# Patient Record
Sex: Male | Born: 1955 | Race: White | Hispanic: No | Marital: Married | Smoking: Never smoker
Health system: Southern US, Community
[De-identification: ages and names within clinical notes are randomized; demographics above are authoritative.]

## PROBLEM LIST (undated history)

## (undated) DIAGNOSIS — E538 Deficiency of other specified B group vitamins: Secondary | ICD-10-CM

## (undated) DIAGNOSIS — K219 Gastro-esophageal reflux disease without esophagitis: Secondary | ICD-10-CM

## (undated) DIAGNOSIS — Z8249 Family history of ischemic heart disease and other diseases of the circulatory system: Secondary | ICD-10-CM

## (undated) DIAGNOSIS — R600 Localized edema: Secondary | ICD-10-CM

## (undated) DIAGNOSIS — M549 Dorsalgia, unspecified: Secondary | ICD-10-CM

## (undated) DIAGNOSIS — M79661 Pain in right lower leg: Secondary | ICD-10-CM

## (undated) DIAGNOSIS — R079 Chest pain, unspecified: Secondary | ICD-10-CM

## (undated) DIAGNOSIS — M2550 Pain in unspecified joint: Secondary | ICD-10-CM

## (undated) DIAGNOSIS — J302 Other seasonal allergic rhinitis: Secondary | ICD-10-CM

## (undated) DIAGNOSIS — G4733 Obstructive sleep apnea (adult) (pediatric): Secondary | ICD-10-CM

## (undated) DIAGNOSIS — M25569 Pain in unspecified knee: Secondary | ICD-10-CM

## (undated) HISTORY — DX: Gastro-esophageal reflux disease without esophagitis: K21.9

## (undated) HISTORY — DX: Other seasonal allergic rhinitis: J30.2

## (undated) HISTORY — DX: Chest pain, unspecified: R07.9

## (undated) HISTORY — DX: Dorsalgia, unspecified: M54.9

## (undated) HISTORY — DX: Pain in unspecified knee: M25.569

## (undated) HISTORY — DX: Family history of ischemic heart disease and other diseases of the circulatory system: Z82.49

## (undated) HISTORY — DX: Pain in unspecified joint: M25.50

## (undated) HISTORY — PX: NOSE SURGERY: SHX723

## (undated) HISTORY — PX: KNEE SURGERY: SHX244

## (undated) HISTORY — DX: Obstructive sleep apnea (adult) (pediatric): G47.33

## (undated) HISTORY — DX: Pain in right lower leg: M79.661

## (undated) HISTORY — DX: Localized edema: R60.0

## (undated) HISTORY — DX: Deficiency of other specified B group vitamins: E53.8

---

## 1998-06-08 ENCOUNTER — Emergency Department (HOSPITAL_COMMUNITY): Admission: EM | Admit: 1998-06-08 | Discharge: 1998-06-08 | Payer: Self-pay | Admitting: Emergency Medicine

## 1998-12-02 ENCOUNTER — Observation Stay (HOSPITAL_COMMUNITY): Admission: EM | Admit: 1998-12-02 | Discharge: 1998-12-02 | Payer: Self-pay | Admitting: Emergency Medicine

## 1998-12-02 ENCOUNTER — Encounter: Payer: Self-pay | Admitting: Interventional Cardiology

## 1998-12-02 ENCOUNTER — Encounter: Payer: Self-pay | Admitting: Family Medicine

## 1998-12-31 ENCOUNTER — Encounter: Payer: Self-pay | Admitting: Family Medicine

## 1998-12-31 ENCOUNTER — Ambulatory Visit (HOSPITAL_COMMUNITY): Admission: RE | Admit: 1998-12-31 | Discharge: 1998-12-31 | Payer: Self-pay | Admitting: Family Medicine

## 2003-04-12 ENCOUNTER — Emergency Department (HOSPITAL_COMMUNITY): Admission: EM | Admit: 2003-04-12 | Discharge: 2003-04-13 | Payer: Self-pay | Admitting: Emergency Medicine

## 2004-10-27 ENCOUNTER — Emergency Department (HOSPITAL_COMMUNITY): Admission: EM | Admit: 2004-10-27 | Discharge: 2004-10-28 | Payer: Self-pay | Admitting: Emergency Medicine

## 2005-12-06 ENCOUNTER — Encounter: Payer: Self-pay | Admitting: Emergency Medicine

## 2008-01-21 ENCOUNTER — Emergency Department (HOSPITAL_COMMUNITY): Admission: EM | Admit: 2008-01-21 | Discharge: 2008-01-21 | Payer: Self-pay | Admitting: Family Medicine

## 2009-10-15 ENCOUNTER — Encounter: Admission: RE | Admit: 2009-10-15 | Discharge: 2009-10-15 | Payer: Self-pay | Admitting: Family Medicine

## 2010-01-08 ENCOUNTER — Emergency Department (HOSPITAL_COMMUNITY): Admission: EM | Admit: 2010-01-08 | Discharge: 2010-01-08 | Payer: Self-pay | Admitting: Emergency Medicine

## 2010-01-24 ENCOUNTER — Emergency Department (HOSPITAL_COMMUNITY): Admission: EM | Admit: 2010-01-24 | Discharge: 2010-01-24 | Payer: Self-pay | Admitting: Family Medicine

## 2010-02-10 DIAGNOSIS — G4733 Obstructive sleep apnea (adult) (pediatric): Secondary | ICD-10-CM

## 2010-02-10 HISTORY — DX: Obstructive sleep apnea (adult) (pediatric): G47.33

## 2010-04-06 ENCOUNTER — Ambulatory Visit: Admission: RE | Admit: 2010-04-06 | Discharge: 2010-04-06 | Payer: Self-pay | Admitting: Family Medicine

## 2010-04-06 ENCOUNTER — Encounter (INDEPENDENT_AMBULATORY_CARE_PROVIDER_SITE_OTHER): Payer: Self-pay | Admitting: Family Medicine

## 2010-04-06 ENCOUNTER — Ambulatory Visit: Payer: Self-pay | Admitting: Vascular Surgery

## 2010-10-24 LAB — CBC
Hemoglobin: 16.8 g/dL (ref 13.0–17.0)
MCV: 90.8 fL (ref 78.0–100.0)
WBC: 9.5 10*3/uL (ref 4.0–10.5)

## 2010-10-24 LAB — COMPREHENSIVE METABOLIC PANEL
ALT: 25 U/L (ref 0–53)
AST: 30 U/L (ref 0–37)
Albumin: 3.9 g/dL (ref 3.5–5.2)
Alkaline Phosphatase: 102 U/L (ref 39–117)
BUN: 10 mg/dL (ref 6–23)
Calcium: 8.4 mg/dL (ref 8.4–10.5)
Chloride: 104 mEq/L (ref 96–112)
Glucose, Bld: 95 mg/dL (ref 70–99)
Potassium: 4 mEq/L (ref 3.5–5.1)
Total Protein: 7.6 g/dL (ref 6.0–8.3)

## 2010-10-24 LAB — DIFFERENTIAL
Basophils Relative: 0 % (ref 0–1)
Monocytes Relative: 6 % (ref 3–12)
Neutro Abs: 8.3 10*3/uL — ABNORMAL HIGH (ref 1.7–7.7)

## 2010-10-24 LAB — PROTIME-INR
INR: 1.01 (ref 0.00–1.49)
Prothrombin Time: 13.2 seconds (ref 11.6–15.2)

## 2010-10-24 LAB — APTT: aPTT: 27 seconds (ref 24–37)

## 2015-05-12 ENCOUNTER — Other Ambulatory Visit: Payer: Self-pay | Admitting: Family Medicine

## 2015-05-12 DIAGNOSIS — M79669 Pain in unspecified lower leg: Secondary | ICD-10-CM

## 2015-05-19 ENCOUNTER — Ambulatory Visit
Admission: RE | Admit: 2015-05-19 | Discharge: 2015-05-19 | Disposition: A | Discharge status: Home / Self Care | Payer: BC Managed Care – PPO | Source: Ambulatory Visit | Attending: Family Medicine | Admitting: Family Medicine

## 2015-05-19 DIAGNOSIS — M79669 Pain in unspecified lower leg: Secondary | ICD-10-CM

## 2015-05-25 ENCOUNTER — Encounter: Payer: Self-pay | Admitting: Interventional Cardiology

## 2015-05-31 ENCOUNTER — Encounter: Payer: Self-pay | Admitting: Interventional Cardiology

## 2015-05-31 ENCOUNTER — Ambulatory Visit (INDEPENDENT_AMBULATORY_CARE_PROVIDER_SITE_OTHER): Payer: BC Managed Care – PPO | Admitting: Interventional Cardiology

## 2015-05-31 VITALS — BP 110/70 | HR 82 | Ht 73.0 in | Wt 244.0 lb

## 2015-05-31 DIAGNOSIS — K219 Gastro-esophageal reflux disease without esophagitis: Secondary | ICD-10-CM

## 2015-05-31 DIAGNOSIS — Z8249 Family history of ischemic heart disease and other diseases of the circulatory system: Secondary | ICD-10-CM | POA: Diagnosis not present

## 2015-05-31 HISTORY — DX: Family history of ischemic heart disease and other diseases of the circulatory system: Z82.49

## 2015-05-31 HISTORY — DX: Gastro-esophageal reflux disease without esophagitis: K21.9

## 2015-05-31 NOTE — Progress Notes (Signed)
Patient ID: James Fernandez, male   DOB: Sep 20, 1955, 59 y.o.   MRN: [id number]     Cardiology Office Note   Date:  05/31/2015   ID:  James Fernandez, DOB [birth year], MRN [id number]  PCP:  James Blamer, MD    No chief complaint on file. Chest discomfort   Wt Readings from Last 3 Encounters:  05/31/15 244 lb (110.678 kg)       History of Present Illness: James Fernandez is a 59 y.o. male  Who has had GERD sx for many years.  He was found to have a decreased pulse on the right foot.  His brother who is 5 years older recently had a CABG x 4.   The patient has changed his diet to prevent GERD.  He does not do a lot of exercise.  He does do work around the house.  No GERD sx yesterday when he moved some wood.    Due to his brother's recent diagnosis of coronary artery disease, he is concerned. His brother is a few years older and did smoke for some time. The patient states that the brother's lifestyle is not as healthy. Regardless, this has brought on some concern for him.  He recalls having a stress test in the year 2000 or 2001 which was negative. He has not had a cardiac evaluation since then.    Past Medical History  Diagnosis Date  . Esophageal reflux   . Seasonal allergies   . Knee pain   . OSA (obstructive sleep apnea) 02/10/10    ESS 18, AHI 23/hr, RDI 29/hr, no REM, O2 nadir 81%; CPAP 9 with AHI 0/hr.  . Chest pain   . Right calf pain     History reviewed. No pertinent past surgical history.   Current Outpatient Prescriptions  Medication Sig Dispense Refill  . pantoprazole (PROTONIX) 40 MG tablet Take 40 mg by mouth daily.    . ranitidine (ZANTAC) 150 MG capsule Take 150 mg by mouth daily as needed for heartburn.     No current facility-administered medications for this visit.    Allergies:   Demerol    Social History:  The patient  reports that he has never smoked. He does not have any smokeless tobacco history on file. He reports that he does not drink alcohol  or use illicit drugs.   Family History:  The patient's family history includes Heart failure in his maternal grandmother; Heart failure (age of onset: 54) in his mother; Prostate cancer in his maternal grandfather; Stroke in his maternal grandfather. There is no history of Heart attack or Hypertension.    ROS:  Please see the history of present illness.   Otherwise, review of systems are positive for acid reflux symptoms.   All other systems are reviewed and negative.    PHYSICAL EXAM: VS:  BP 110/70 mmHg  Pulse 82  Ht  (1.854 m)  Wt 244 lb (110.678 kg)  BMI 32.20 kg/m2  SpO2 94% , BMI Body mass index is 32.2 kg/(m^2). GEN: Well nourished, well developed, in no acute distress HEENT: normal Neck: no JVD, carotid bruits, or masses Cardiac: RRR; no murmurs, rubs, or gallops,1+ pitting lower extremity edema bilaterally, 2+ posterior tibial pulses bilaterally Respiratory:  clear to auscultation bilaterally, normal work of breathing GI: soft, nontender, nondistended, + BS MS: no deformity or atrophy Skin: warm and dry, no rash Neuro:  Strength and sensation are intact Psych: euthymic mood, full affect   EKG:   The ekg  from primary care demonstrates normal ECG   Recent Labs: No results found for requested labs within last 365 days.   Lipid Panel No results found for: CHOL, TRIG, HDL, CHOLHDL, VLDL, LDLCALC, LDLDIRECT   Other studies Reviewed: Additional studies/ records that were reviewed today with results demonstrating: ECG from primary care as noted above, hemoglobin A1c 5.4 in October 2016, normal TSH in October 2016, LDL 106 in August 2015.   ASSESSMENT AND PLAN:  1. Family history of coronary artery disease: Plan for exercise treadmill test to evaluate for ischemia. He'll need aggressive lifestyle changes including an increase exercise and eating healthy diet. He would benefit from some mild weight loss as well. 2. GERD: Want to make sure this symptom is not related to  heart disease. 3. Edema: Likely related to venous insufficiency. Elevate legs at night to help.   Current medicines are reviewed at length with the patient today.  The patient concerns regarding his medicines were addressed.  The following changes have been made:  No change  Labs/ tests ordered today include:   Orders Placed This Encounter  Procedures  . Exercise Tolerance Test    Recommend 150 minutes/week of aerobic exercise Low fat, low carb, high fiber diet recommended  Disposition:   FU in 1 year, also for ETT in near future   James JacksonSigned, James S., MD  05/31/2015 5:13 PM    [facility] [address], [place], [place]  [address] Phone: [phone]; Fax: [phone]

## 2015-05-31 NOTE — Patient Instructions (Signed)
**Note De-identified James Fernandez Obfuscation** Medication Instructions:  Same-no changes  Labwork: None  Testing/Procedures: Your physician has requested that you have an exercise tolerance test. For further information please visit [email or web address]. Please also follow instruction sheet, as given.   Follow-Up: Your physician wants you to follow-up in: 1 year. You will receive a reminder letter in the mail two months in advance. If you don't receive a letter, please call our office to schedule the follow-up appointment.       If you need a refill on your cardiac medications before your next appointment, please call your pharmacy.   

## 2015-07-06 DIAGNOSIS — M79661 Pain in right lower leg: Secondary | ICD-10-CM | POA: Insufficient documentation

## 2015-07-06 DIAGNOSIS — K219 Gastro-esophageal reflux disease without esophagitis: Secondary | ICD-10-CM | POA: Insufficient documentation

## 2015-07-06 DIAGNOSIS — R079 Chest pain, unspecified: Secondary | ICD-10-CM | POA: Insufficient documentation

## 2015-07-06 DIAGNOSIS — M25569 Pain in unspecified knee: Secondary | ICD-10-CM | POA: Insufficient documentation

## 2015-07-06 DIAGNOSIS — J302 Other seasonal allergic rhinitis: Secondary | ICD-10-CM | POA: Insufficient documentation

## 2015-07-07 ENCOUNTER — Encounter: Payer: BC Managed Care – PPO | Admitting: Nurse Practitioner

## 2015-07-07 ENCOUNTER — Ambulatory Visit (INDEPENDENT_AMBULATORY_CARE_PROVIDER_SITE_OTHER): Payer: BC Managed Care – PPO

## 2015-07-07 DIAGNOSIS — Z8249 Family history of ischemic heart disease and other diseases of the circulatory system: Secondary | ICD-10-CM

## 2015-07-07 LAB — EXERCISE TOLERANCE TEST
CSEPED: 9 min
CSEPPHR: 164 {beats}/min
Estimated workload: 10.1 METS
Exercise duration (sec): 0 s
MPHR: 163 {beats}/min
Percent HR: 100 %
RPE: 15
Rest HR: 81 {beats}/min

## 2015-08-17 ENCOUNTER — Encounter: Payer: Self-pay | Admitting: Gastroenterology

## 2018-09-19 ENCOUNTER — Encounter (INDEPENDENT_AMBULATORY_CARE_PROVIDER_SITE_OTHER): Payer: Self-pay

## 2018-09-23 ENCOUNTER — Emergency Department (HOSPITAL_COMMUNITY)
Admission: EM | Admit: 2018-09-23 | Discharge: 2018-09-23 | Disposition: A | Discharge status: Home / Self Care | Payer: BC Managed Care – PPO | Attending: Emergency Medicine | Admitting: Emergency Medicine

## 2018-09-23 ENCOUNTER — Emergency Department (HOSPITAL_COMMUNITY): Payer: BC Managed Care – PPO

## 2018-09-23 DIAGNOSIS — R1110 Vomiting, unspecified: Secondary | ICD-10-CM | POA: Diagnosis not present

## 2018-09-23 DIAGNOSIS — Z79899 Other long term (current) drug therapy: Secondary | ICD-10-CM | POA: Insufficient documentation

## 2018-09-23 LAB — CBC
HCT: 50.7 % (ref 39.0–52.0)
HEMOGLOBIN: 16.5 g/dL (ref 13.0–17.0)
MCH: 29.7 pg (ref 26.0–34.0)
MCHC: 32.5 g/dL (ref 30.0–36.0)
MCV: 91.2 fL (ref 80.0–100.0)
Platelets: 212 10*3/uL (ref 150–400)
RBC: 5.56 MIL/uL (ref 4.22–5.81)
RDW: 12.6 % (ref 11.5–15.5)
WBC: 14.1 10*3/uL — AB (ref 4.0–10.5)
nRBC: 0 % (ref 0.0–0.2)

## 2018-09-23 LAB — COMPREHENSIVE METABOLIC PANEL
ALT: 30 U/L (ref 0–44)
AST: 28 U/L (ref 15–41)
Albumin: 4.3 g/dL (ref 3.5–5.0)
Alkaline Phosphatase: 94 U/L (ref 38–126)
Anion gap: 9 (ref 5–15)
BUN: 17 mg/dL (ref 8–23)
CHLORIDE: 103 mmol/L (ref 98–111)
CO2: 26 mmol/L (ref 22–32)
Calcium: 8.7 mg/dL — ABNORMAL LOW (ref 8.9–10.3)
Creatinine, Ser: 0.93 mg/dL (ref 0.61–1.24)
Glucose, Bld: 122 mg/dL — ABNORMAL HIGH (ref 70–99)
Potassium: 4.5 mmol/L (ref 3.5–5.1)
SODIUM: 138 mmol/L (ref 135–145)
Total Bilirubin: 1.4 mg/dL — ABNORMAL HIGH (ref 0.3–1.2)
Total Protein: 8.2 g/dL — ABNORMAL HIGH (ref 6.5–8.1)

## 2018-09-23 LAB — LIPASE, BLOOD: LIPASE: 35 U/L (ref 11–51)

## 2018-09-23 MED ORDER — SODIUM CHLORIDE 0.9 % IV BOLUS
1000.0000 mL | Freq: Once | INTRAVENOUS | Status: AC
  Administered 2018-09-23: 1000 mL via INTRAVENOUS

## 2018-09-23 MED ORDER — KETOROLAC TROMETHAMINE 30 MG/ML IJ SOLN
30.0000 mg | Freq: Once | INTRAMUSCULAR | Status: AC
  Administered 2018-09-23: 30 mg via INTRAVENOUS
  Filled 2018-09-23: qty 1

## 2018-09-23 MED ORDER — DICYCLOMINE HCL 20 MG PO TABS: ORAL_TABLET | ORAL | 0 refills | Status: DC

## 2018-09-23 MED ORDER — ONDANSETRON 4 MG PO TBDP: ORAL_TABLET | ORAL | 0 refills | Status: DC

## 2018-09-23 MED ORDER — SODIUM CHLORIDE 0.9% FLUSH
3.0000 mL | Freq: Once | INTRAVENOUS | Status: AC
  Administered 2018-09-23: 3 mL via INTRAVENOUS

## 2018-09-23 MED FILL — ONDANSETRON ODT 4 MG TABLET: 4 | 2 days supply | Qty: 15 | Fill #0

## 2018-09-23 MED FILL — DICYCLOMINE 20 MG TABLET: 20 | 6 days supply | Qty: 20 | Fill #0

## 2018-09-23 NOTE — ED Notes (Signed)
Bed: WA13 Expected date:  Expected time:  Means of arrival:  Comments: EMS/n/v/ 

## 2018-09-23 NOTE — ED Triage Notes (Signed)
Transported by [facility] from home-- + n/v x 6 hours, approximately 30-40 x per patient. +generalized abdominal pain, denies any urinary symptoms or trouble with bowel movements. 18 G R AC, 500 ml of NS and 4 mg of Zofran PTA.

## 2018-09-23 NOTE — Discharge Instructions (Addendum)
Drink plenty of fluids and follow-up with your doctor if not improving °

## 2018-09-23 NOTE — ED Provider Notes (Signed)
[facility]-EMERGENCY DEPT Provider Note   CSN: [id number] Arrival date & time: 09/23/18  0755    History   Chief Complaint Chief Complaint  Patient presents with  . Emesis    HPI James Fernandez is a 63 y.o. male.     Patient complains of vomiting and abdominal cramping.  The history is provided by the patient. No language interpreter was used.  Emesis  Severity:  Moderate Timing:  Constant Number of daily episodes:  5 Quality:  Stomach contents Able to tolerate:  Liquids Progression:  Worsening Chronicity:  New Associated symptoms: abdominal pain   Associated symptoms: no cough, no diarrhea and no headaches     Past Medical History:  Diagnosis Date  . Chest pain   . Esophageal reflux   . Family history of coronary arteriosclerosis 05/31/2015  . GERD (gastroesophageal reflux disease) 05/31/2015  . Knee pain   . OSA (obstructive sleep apnea) 02/10/10   ESS 18, AHI 23/hr, RDI 29/hr, no REM, O2 nadir 81%; CPAP 9 with AHI 0/hr.  . Right calf pain   . Seasonal allergies     Patient Active Problem List   Diagnosis Date Noted  . Esophageal reflux   . Seasonal allergies   . Knee pain   . Chest pain   . Right calf pain   . GERD (gastroesophageal reflux disease) 05/31/2015  . Family history of coronary arteriosclerosis 05/31/2015  . OSA (obstructive sleep apnea) 02/10/2010    Past Surgical History:  Procedure Laterality Date  . NO PAST SURGERIES          Home Medications    Prior to Admission medications   Medication Sig Start Date End Date Taking? Authorizing Provider  Multiple Vitamin (MULTIVITAMIN WITH MINERALS) TABS tablet Take 1 tablet by mouth daily.   Yes [provider]  omeprazole (PRILOSEC) 20 MG capsule Take 20 mg by mouth daily.   Yes [provider]  dicyclomine (BENTYL) 20 MG tablet Take 1 every 8 hours as needed for abdominal cramps 09/23/18   Bethann BerkshireZammit, Nysha Koplin, MD  ondansetron (ZOFRAN ODT) 4 MG  disintegrating tablet 4mg  ODT q4 hours prn nausea/vomit 09/23/18   Bethann BerkshireZammit, Jenene Kauffmann, MD    Family History Family History  Problem Relation Age of Onset  . Heart failure Mother 3966  . Heart failure Maternal Grandmother   . Prostate cancer Maternal Grandfather   . Heart attack Neg Hx   . Hypertension Neg Hx   . Stroke Maternal Grandfather        6270    Social History Social History   Tobacco Use  . Smoking status: Never Smoker  Substance Use Topics  . Alcohol use: No    Alcohol/week: 0.0 standard drinks  . Drug use: No     Allergies   Demerol [meperidine]   Review of Systems Review of Systems  Constitutional: Negative for appetite change and fatigue.  HENT: Negative for congestion, ear discharge and sinus pressure.   Eyes: Negative for discharge.  Respiratory: Negative for cough.   Cardiovascular: Negative for chest pain.  Gastrointestinal: Positive for abdominal pain and vomiting. Negative for diarrhea.  Genitourinary: Negative for frequency and hematuria.  Musculoskeletal: Negative for back pain.  Skin: Negative for rash.  Neurological: Negative for seizures and headaches.  Psychiatric/Behavioral: Negative for hallucinations.     Physical Exam Updated Vital Signs BP 133/73 (BP Location: Left Arm)   Pulse 87   Temp 98.8 F (37.1 C)   Resp 16   SpO2  95%   Physical Exam Vitals signs and nursing note reviewed.  Constitutional:      Appearance: He is well-developed.  HENT:     Head: Normocephalic.     Nose: Nose normal.  Eyes:     General: No scleral icterus.    Conjunctiva/sclera: Conjunctivae normal.  Neck:     Musculoskeletal: Neck supple.     Thyroid: No thyromegaly.  Cardiovascular:     Rate and Rhythm: Normal rate and regular rhythm.     Heart sounds: No murmur. No friction rub. No gallop.   Pulmonary:     Breath sounds: No stridor. No wheezing or rales.  Chest:     Chest wall: No tenderness.  Abdominal:     General: There is no distension.      Tenderness: There is abdominal tenderness. There is no rebound.     Comments: Tender right upper quadrant  Musculoskeletal: Normal range of motion.  Lymphadenopathy:     Cervical: No cervical adenopathy.  Skin:    Findings: No erythema or rash.  Neurological:     Mental Status: He is oriented to person, place, and time.     Motor: No abnormal muscle tone.     Coordination: Coordination normal.  Psychiatric:        Behavior: Behavior normal.      ED Treatments / Results  Labs (all labs ordered are listed, but only abnormal results are displayed) Labs Reviewed  COMPREHENSIVE METABOLIC PANEL - Abnormal; Notable for the following components:      Result Value   Glucose, Bld 122 (*)    Calcium 8.7 (*)    Total Protein 8.2 (*)    Total Bilirubin 1.4 (*)    All other components within normal limits  CBC - Abnormal; Notable for the following components:   WBC 14.1 (*)    All other components within normal limits  LIPASE, BLOOD    EKG None  Radiology US Abdomen Complete  Result Date: 09/23/2018 CLINICAL DATA:  Abdominal pain, nausea and vomiting. EXAM: ABDOMEN ULTRASOUND COMPLETE COMPARISON:  CT scan 01/08/2010 FINDINGS: Gallbladder: No gallstones, wall thickening or pericholecystic fluid. Patient was tender over the gallbladder during the examination. Common bile duct: Diameter: 2.2 mm Liver: Normal echogenicity without focal lesion or biliary dilatation. Portal vein is patent on color Doppler imaging with normal direction of blood flow towards the liver. IVC: Normal caliber Pancreas: Poorly visualized due to overlying bowel gas. Spleen: Normal size.  No focal lesions. Right Kidney: Length: 12.7. Normal renal cortical thickness and echogenicity without focal lesions or hydronephrosis. Left Kidney: Length: 13.3. Normal renal cortical thickness and echogenicity without focal lesions or hydronephrosis. Abdominal aorta: No aneurysm visualized. Other findings: None. IMPRESSION: 1.  Positive sonographic Murphy sign but no sonographic findings to suggest acute cholecystitis. 2. Normal sonographic appearance of the liver, spleen and both kidneys. 3. Poor visualization of the pancreas and limited visualization of the aorta. Electronically Signed   By: Rudie Meyer M.D.   On: 09/23/2018 14:23   Dg Abd Acute 2+v W 1v Chest  Result Date: 09/23/2018 CLINICAL DATA:  Diffuse abdominal pain. Intractable nausea and vomiting for 1 day. EXAM: DG ABDOMEN ACUTE W/ 1V CHEST COMPARISON:  CT abdomen and pelvis 01/28/2010. FINDINGS: Heart size is normal. Mild left basilar airspace disease is present. Lungs are otherwise clear. There is no edema or effusion. Bowel gas pattern is normal.  No obstruction or free air is present. There is partial sacralization of L5. Axial skeleton  is otherwise within normal limits. IMPRESSION: 1. Mild left basilar airspace disease is present. This likely reflects atelectasis. 2. Normal bowel gas pattern. No acute or focal abnormality to explain the patient's abdominal pain. Electronically Signed   By: Marin Roberts M.D.   On: 09/23/2018 11:40    Procedures Procedures (including critical care time)  Medications Ordered in ED Medications  sodium chloride flush (NS) 0.9 % injection 3 mL (3 mLs Intravenous Given 09/23/18 0816)  sodium chloride 0.9 % bolus 1,000 mL (0 mLs Intravenous Stopped 09/23/18 1126)  sodium chloride 0.9 % bolus 1,000 mL (0 mLs Intravenous Stopped 09/23/18 1200)  ketorolac (TORADOL) 30 MG/ML injection 30 mg (30 mg Intravenous Given 09/23/18 1016)     Initial Impression / Assessment and Plan / ED Course  I have reviewed the triage vital signs and the nursing notes.  Pertinent labs & imaging results that were available during my care of the patient were reviewed by me and considered in my medical decision making (see chart for details).        Patient with vomiting.  Labs unremarkable except for white count slightly elevated.   Ultrasound negative.  Suspect viral syndrome.  Patient given Zofran Bentyl and told to double up on his Prilosec and he will follow-up with his PCP Final Clinical Impressions(s) / ED Diagnoses   Final diagnoses:  Acute vomiting    ED Discharge Orders         Ordered    ondansetron (ZOFRAN ODT) 4 MG disintegrating tablet     09/23/18 1441    dicyclomine (BENTYL) 20 MG tablet     09/23/18 1441           Bethann Berkshire, MD 09/23/18 1444

## 2018-09-26 ENCOUNTER — Encounter (INDEPENDENT_AMBULATORY_CARE_PROVIDER_SITE_OTHER): Payer: Self-pay

## 2018-09-30 ENCOUNTER — Encounter (INDEPENDENT_AMBULATORY_CARE_PROVIDER_SITE_OTHER): Payer: Self-pay

## 2018-10-01 ENCOUNTER — Encounter (INDEPENDENT_AMBULATORY_CARE_PROVIDER_SITE_OTHER): Payer: Self-pay | Admitting: Family Medicine

## 2018-10-01 ENCOUNTER — Ambulatory Visit (INDEPENDENT_AMBULATORY_CARE_PROVIDER_SITE_OTHER): Payer: BC Managed Care – PPO | Admitting: Family Medicine

## 2018-10-01 VITALS — BP 111/73 | HR 71 | Ht 73.0 in | Wt 236.0 lb

## 2018-10-01 DIAGNOSIS — Z0289 Encounter for other administrative examinations: Secondary | ICD-10-CM

## 2018-10-01 DIAGNOSIS — E669 Obesity, unspecified: Secondary | ICD-10-CM

## 2018-10-01 DIAGNOSIS — R0602 Shortness of breath: Secondary | ICD-10-CM | POA: Diagnosis not present

## 2018-10-01 DIAGNOSIS — E538 Deficiency of other specified B group vitamins: Secondary | ICD-10-CM

## 2018-10-01 DIAGNOSIS — E559 Vitamin D deficiency, unspecified: Secondary | ICD-10-CM

## 2018-10-01 DIAGNOSIS — R5383 Other fatigue: Secondary | ICD-10-CM

## 2018-10-01 DIAGNOSIS — Z6831 Body mass index (BMI) 31.0-31.9, adult: Secondary | ICD-10-CM

## 2018-10-01 DIAGNOSIS — G4733 Obstructive sleep apnea (adult) (pediatric): Secondary | ICD-10-CM | POA: Diagnosis not present

## 2018-10-01 DIAGNOSIS — Z1331 Encounter for screening for depression: Secondary | ICD-10-CM

## 2018-10-01 DIAGNOSIS — R739 Hyperglycemia, unspecified: Secondary | ICD-10-CM

## 2018-10-01 DIAGNOSIS — Z9189 Other specified personal risk factors, not elsewhere classified: Secondary | ICD-10-CM

## 2018-10-01 NOTE — Progress Notes (Signed)
.  Office: [phone]  /  Fax: [phone]   HPI:   Chief Complaint: OBESITY  James Fernandez (MR# [id number]) is a 63 y.o. male who presents on 10/01/2018 for obesity evaluation and treatment. Current BMI is Body mass index is 31.14 kg/m.  James Fernandez has struggled with obesity for years and has been unsuccessful in either losing weight or maintaining long term weight loss. James Fernandez attended our information session and states he is currently in the action stage of change and ready to dedicate time achieving and maintaining a healthier weight.   James Fernandez states his family eats meals together he thinks his family will eat healthier with him his desired weight loss is 36 lbs he started gaining weight in the last 20 years his heaviest weight ever was 254 lbs. he snacks frequently in the evenings he is trying to eat vegetarian he is frequently drinking liquids with calories he has binge eating behaviors   Fatigue James Fernandez feels his energy is lower than it should be. This has worsened with weight gain and has not worsened recently. Patient is at risk for obstructive sleep apnea. Patent has a history of sleep apnea. Patient generally gets 5 or 6 hours of sleep per night, and states they generally have generally restful sleep. Snoring is not present. Apneic episodes are not present. Epworth Sleepiness Score is 17.  Dyspnea on exertion James Fernandez notes increasing shortness of breath with exercising and seems to be worsening over time with weight gain. He notes getting out of breath sooner with activity than he used to. This has not gotten worse recently. James Fernandez denies orthopnea.  Vitamin B12 Deficiency James Fernandez has a diagnosis of B12 insufficiency and notes fatigue. This is not a new diagnosis and he is on a multivitamin. James Fernandez is a vegetarian and is not eating much protein. He does not have a previous diagnosis of pernicious anemia. He does not have a history of weight loss surgery.   Sleep  Apnea James Fernandez's Epworth Score is 17 and he is not using a CPAP. He admits fatigue.  Vitamin D Deficiency James Fernandez has a diagnosis of vitamin D deficiency. He is currently taking a multivitamin and does not have recent labs. He admits fatigue.  Hyperglycemia James Fernandez has a history of some elevated blood glucose readings last week while he was sick. He admits to polyphagia.  At risk for cardiovascular disease James Fernandez is at a higher than average risk for cardiovascular disease due to sleep apnea, hyperglycemia, and obesity.  Depression Screen James Fernandez's Food and Mood (modified PHQ-9) score was 4. Depression screen James Fernandez 2/9 10/01/2018  Decreased Interest 1  Down, Depressed, Hopeless 0  PHQ - 2 Score 1  Altered sleeping 2  Tired, decreased energy 1  Change in appetite 0  Feeling bad or failure about yourself  0  Trouble concentrating 0  Moving slowly or fidgety/restless 0  Suicidal thoughts 0  PHQ-9 Score 4  Difficult doing work/chores Not difficult at all   ASSESSMENT AND PLAN:  Other fatigue - Plan: EKG 12-Lead, CBC With Differential, Comprehensive metabolic panel, Folate, T3, T4, free, TSH  Shortness of breath on exertion - Plan: Lipid Panel With LDL/HDL Ratio  B12 nutritional deficiency - Plan: Vitamin B12  Obstructive sleep apnea syndrome  Vitamin D deficiency - Plan: VITAMIN D 25 Hydroxy (Vit-D Deficiency, Fractures)  Hyperglycemia - Plan: Hemoglobin A1c, Insulin, random  Depression screening  At risk for heart disease  Class 1 obesity with serious comorbidity and body mass index (BMI) of 31.0 to 31.9  in adult, unspecified obesity type  PLAN:  Fatigue James Fernandez was informed that his fatigue may be related to obesity, depression or many other causes. Labs will be ordered, and in the meanwhile James Fernandez has agreed to work on diet, exercise and weight loss to help with fatigue. Proper sleep hygiene was discussed including the need for 7-8 hours of quality sleep each night. A  sleep study was not ordered based on symptoms and Epworth score. An EKG and an indirect calorimetry was ordered today.  Dyspnea on exertion James Fernandez's shortness of breath appears to be obesity related and exercise induced. He has agreed to work on weight loss and gradually increase exercise to treat his exercise induced shortness of breath. If James Fernandez follows our instructions and loses weight without improvement of his shortness of breath, we will plan to refer to pulmonology.  We ordered an indirect calorimetry, EKG, and labs today.We will monitor this condition regularly. James Fernandez agrees to this plan.  Vitamin B12 Deficiency James Fernandez will work on increasing B12 rich foods in his diet. B12 supplementation was not prescribed today. We will plan on checking labs today and he will follow up as directed.  Sleep Apnea James Fernandez's indirect calorimetry today show that his RMR is decreased likely in part to his reduced oxygen level. James Fernandez was encouraged to reconsider the CPAP and will work on diet and exercise in the meanwhile. He agrees to follow up as directed.  Vitamin D Deficiency James Fernandez was informed that low vitamin D levels contributes to fatigue and are associated with obesity, breast, and colon cancer. Labs were ordered today and James Fernandez agrees to follow up in 2 weeks.  Hyperglycemia Fasting labs will be obtained and results with be discussed with James Fernandez in 2 weeks at his follow up visit. In the meanwhile James Fernandez was started on a lower simple carbohydrate diet and will work on weight loss efforts. He will follow up at the agreed upon time.  Cardiovascular risk counseling James Fernandez was given extended (15 minutes) coronary artery disease prevention counseling today. He is 63 y.o. male and has risk factors for heart disease including sleep apnea, hyperglycemia, and obesity. We discussed intensive lifestyle modifications today with an emphasis on specific weight loss instructions and strategies. Pt was  also informed of the importance of increasing exercise and decreasing saturated fats to help prevent heart disease.  Depression Screen James Fernandez had a negative depression screening. Depression is commonly associated with obesity and often results in emotional eating behaviors. We will monitor this closely and work on CBT to help improve the non-hunger eating patterns. Referral to Psychology may be required if no improvement is seen as he continues in our clinic.  Obesity James Fernandez is currently in the action stage of change and his goal is to continue with weight loss efforts. He has agreed to keep a food journal with 1400 to 1500 calories and 85+ grams of protein daily. James Fernandez has been instructed to work up to a goal of 150 minutes of combined cardio and strengthening exercise per week for weight loss and overall health benefits. We discussed the following Behavioral Modification Strategies today: increasing lean protein intake, decreasing simple carbohydrates, and work on meal planning and easy cooking plans.  James Fernandez has agreed to follow up with our clinic in 2 weeks. He was informed of the importance of frequent follow up visits to maximize his success with intensive lifestyle modifications for his multiple health conditions. He was informed we would discuss his lab results at his next visit unless there  is a critical issue that needs to be addressed sooner. James Fernandez agreed to keep his next visit at the agreed upon time to discuss these results.  ALLERGIES: Allergies  Allergen Reactions  . Demerol [Meperidine]     BP bottom out    MEDICATIONS: Current Outpatient Medications on File Prior to Visit  Medication Sig Dispense Refill  . Multiple Vitamin (MULTIVITAMIN WITH MINERALS) TABS tablet Take 1 tablet by mouth daily.    Marland Kitchen omeprazole (PRILOSEC) 20 MG capsule Take 20 mg by mouth daily.     No current facility-administered medications on file prior to visit.     PAST MEDICAL HISTORY: Past  Medical History:  Diagnosis Date  . Back pain   . Chest pain   . Esophageal reflux   . Family history of coronary arteriosclerosis 05/31/2015  . GERD (gastroesophageal reflux disease) 05/31/2015  . Joint pain   . Knee pain   . Lower extremity edema   . OSA (obstructive sleep apnea) 02/10/10   ESS 18, AHI 23/hr, RDI 29/hr, no REM, O2 nadir 81%; CPAP 9 with AHI 0/hr.  . Right calf pain   . Seasonal allergies   . Vitamin B 12 deficiency     PAST SURGICAL HISTORY: Past Surgical History:  Procedure Laterality Date  . KNEE SURGERY     05/1975, 09/1975  . NOSE SURGERY     11/1973, 10/1979    SOCIAL HISTORY: Social History   Tobacco Use  . Smoking status: Never Smoker  . Smokeless tobacco: Never Used  Substance Use Topics  . Alcohol use: No    Alcohol/week: 0.0 standard drinks  . Drug use: No    FAMILY HISTORY: Family History  Problem Relation Age of Onset  . Heart failure Mother 27  . Anxiety disorder Mother   . Heart failure Maternal Grandmother   . Prostate cancer Maternal Grandfather   . Stroke Maternal Grandfather        5  . Heart attack Neg Hx   . Hypertension Neg Hx     ROS: Review of Systems  Constitutional: Positive for malaise/fatigue. Negative for weight loss.  HENT:       Positive for nasal stuffiness. Positive for dry mouth.  Eyes:       Positive for vision changes.  Respiratory: Positive for shortness of breath.   Cardiovascular: Negative for orthopnea.       Positive for leg cramping.  Gastrointestinal: Positive for heartburn.  Musculoskeletal: Positive for back pain, joint pain and myalgias.  Skin:       Positive for dryness.  Endo/Heme/Allergies: Positive for polydipsia.    PHYSICAL EXAM: Blood pressure 111/73, pulse 71, height  (1.854 m), weight 236 lb (107 kg), SpO2 96 %. Body mass index is 31.14 kg/m. Physical Exam Vitals signs reviewed.  Constitutional:      Appearance: Normal appearance. He is obese.  HENT:     Head:  Normocephalic and atraumatic.     Nose: Nose normal.  Eyes:     General: No scleral icterus.    Extraocular Movements: Extraocular movements intact.  Neck:     Musculoskeletal: Normal range of motion and neck supple.     Thyroid: No thyromegaly.     Comments: Negative for thyromegaly. Cardiovascular:     Rate and Rhythm: Normal rate and regular rhythm.  Pulmonary:     Effort: Pulmonary effort is normal. No respiratory distress.  Abdominal:     Palpations: Abdomen is soft.     Tenderness:  There is no abdominal tenderness.     Comments: Positive for obesity.  Musculoskeletal:     Comments: ROM normal in all extremities.  Skin:    General: Skin is warm and dry.  Neurological:     Mental Status: He is alert and oriented to person, place, and time.     Coordination: Coordination normal.  Psychiatric:        Mood and Affect: Mood normal.        Behavior: Behavior normal.     RECENT LABS AND TESTS: BMET    Component Value Date/Time   NA 138 09/23/2018 0806   K 4.5 09/23/2018 0806   CL 103 09/23/2018 0806   CO2 26 09/23/2018 0806   GLUCOSE 122 (H) 09/23/2018 0806   BUN 17 09/23/2018 0806   CREATININE 0.93 09/23/2018 0806   CALCIUM 8.7 (L) 09/23/2018 0806   GFRNONAA >60 09/23/2018 0806   GFRAA >60 09/23/2018 0806   No results found for: HGBA1C No results found for: INSULIN CBC    Component Value Date/Time   WBC 14.1 (H) 09/23/2018 0806   RBC 5.56 09/23/2018 0806   HGB 16.5 09/23/2018 0806   HCT 50.7 09/23/2018 0806   PLT 212 09/23/2018 0806   MCV 91.2 09/23/2018 0806   MCH 29.7 09/23/2018 0806   MCHC 32.5 09/23/2018 0806   RDW 12.6 09/23/2018 0806   LYMPHSABS 0.6 (L) 01/08/2010 1510   MONOABS 0.5 01/08/2010 1510   EOSABS 0.0 01/08/2010 1510   BASOSABS 0.0 01/08/2010 1510   Iron/TIBC/Ferritin/ %Sat No results found for: IRON, TIBC, FERRITIN, IRONPCTSAT Lipid Panel  No results found for: CHOL, TRIG, HDL, CHOLHDL, VLDL, LDLCALC, LDLDIRECT Hepatic Function Panel      Component Value Date/Time   PROT 8.2 (H) 09/23/2018 0806   ALBUMIN 4.3 09/23/2018 0806   AST 28 09/23/2018 0806   ALT 30 09/23/2018 0806   ALKPHOS 94 09/23/2018 0806   BILITOT 1.4 (H) 09/23/2018 0806   No results found for: TSH  ECG  shows NSR with a rate of 62 BPM. INDIRECT CALORIMETER done today shows a VO2 of 259 and a REE of 1799. His calculated basal metabolic rate is 8329 thus his basal metabolic rate is worse than expected.  OBESITY BEHAVIORAL INTERVENTION VISIT  Today's visit was # 1   Starting weight: 236 lbs Starting date: 10/01/18 Today's weight : Weight: 236 lb (107 kg)  Today's date: 10/01/2018 Total lbs lost to date: 0    10/01/2018  Height 6\' 1"  (1.854 m)  Weight 236 lb (107 kg)  BMI (Calculated) 31.14  BLOOD PRESSURE - SYSTOLIC 111  BLOOD PRESSURE - DIASTOLIC 73  Waist Measurement  45 inches   Body Fat % 28.2 %  Total Body Water (lbs) 115.4 lbs  RMR 1799    ASK: We discussed the diagnosis of obesity with James Fernandez today and James Fernandez agreed to give [place] permission to discuss obesity behavioral modification therapy today.  ASSESS: James Fernandez has the diagnosis of obesity and his BMI today is 31.14. Jeudy is in the action stage of change.   ADVISE: Lawerence was educated on the multiple health risks of obesity as well as the benefit of weight loss to improve his health. He was advised of the need for long term treatment and the importance of lifestyle modifications to improve his current health and to decrease his risk of future health problems.  AGREE: Multiple dietary modification options and treatment options were discussed and Elizar agreed to follow the recommendations  documented in the above note.  ARRANGE: Kaius was educated on the importance of frequent visits to treat obesity as outlined per CMS and USPSTF guidelines and agreed to schedule his next follow up appointment today.   IKirke Corin, CMA, am acting as transcriptionist for Wilder Glade, MD  I have reviewed the above documentation for accuracy and completeness, and I agree with the above. -Quillian Quince, MD

## 2018-10-02 LAB — HEMOGLOBIN A1C
Est. average glucose Bld gHb Est-mCnc: 108 mg/dL
Hgb A1c MFr Bld: 5.4 % (ref 4.8–5.6)

## 2018-10-02 LAB — CBC WITH DIFFERENTIAL
Basophils Absolute: 0 10*3/uL (ref 0.0–0.2)
Basos: 1 %
EOS (ABSOLUTE): 0.1 10*3/uL (ref 0.0–0.4)
Eos: 2 %
Hematocrit: 48.3 % (ref 37.5–51.0)
Hemoglobin: 16.1 g/dL (ref 13.0–17.7)
Immature Grans (Abs): 0 10*3/uL (ref 0.0–0.1)
Immature Granulocytes: 1 %
Lymphocytes Absolute: 1.7 10*3/uL (ref 0.7–3.1)
Lymphs: 29 %
MCH: 29.5 pg (ref 26.6–33.0)
MCHC: 33.3 g/dL (ref 31.5–35.7)
MCV: 89 fL (ref 79–97)
Monocytes Absolute: 0.4 10*3/uL (ref 0.1–0.9)
Monocytes: 7 %
NEUTROS PCT: 60 %
Neutrophils Absolute: 3.6 10*3/uL (ref 1.4–7.0)
RBC: 5.45 x10E6/uL (ref 4.14–5.80)
RDW: 12.5 % (ref 11.6–15.4)
WBC: 5.8 10*3/uL (ref 3.4–10.8)

## 2018-10-02 LAB — LIPID PANEL WITH LDL/HDL RATIO
Cholesterol, Total: 142 mg/dL (ref 100–199)
HDL: 27 mg/dL — AB (ref >39)
LDL Calculated: 92 mg/dL (ref 0–99)
LDl/HDL Ratio: 3.4 ratio (ref 0.0–3.6)
Triglycerides: 115 mg/dL (ref 0–149)
VLDL Cholesterol Cal: 23 mg/dL (ref 5–40)

## 2018-10-02 LAB — COMPREHENSIVE METABOLIC PANEL
ALT: 25 IU/L (ref 0–44)
AST: 21 IU/L (ref 0–40)
Albumin/Globulin Ratio: 1.3 (ref 1.2–2.2)
Albumin: 4.4 g/dL (ref 3.8–4.8)
Alkaline Phosphatase: 103 IU/L (ref 39–117)
BUN/Creatinine Ratio: 13 (ref 10–24)
BUN: 11 mg/dL (ref 8–27)
Bilirubin Total: 0.9 mg/dL (ref 0.0–1.2)
CHLORIDE: 102 mmol/L (ref 96–106)
CO2: 24 mmol/L (ref 20–29)
Calcium: 9.3 mg/dL (ref 8.6–10.2)
Creatinine, Ser: 0.87 mg/dL (ref 0.76–1.27)
GFR calc Af Amer: 108 mL/min/{1.73_m2} (ref >59)
GFR calc non Af Amer: 93 mL/min/{1.73_m2} (ref >59)
GLUCOSE: 83 mg/dL (ref 65–99)
Globulin, Total: 3.5 g/dL (ref 1.5–4.5)
Potassium: 5.4 mmol/L — ABNORMAL HIGH (ref 3.5–5.2)
Sodium: 141 mmol/L (ref 134–144)
Total Protein: 7.9 g/dL (ref 6.0–8.5)

## 2018-10-02 LAB — TSH: TSH: 1.24 u[IU]/mL (ref 0.450–4.500)

## 2018-10-02 LAB — INSULIN, RANDOM: INSULIN: 11.9 u[IU]/mL (ref 2.6–24.9)

## 2018-10-02 LAB — T4, FREE: FREE T4: 1.17 ng/dL (ref 0.82–1.77)

## 2018-10-02 LAB — VITAMIN B12: Vitamin B-12: 333 pg/mL (ref 232–1245)

## 2018-10-02 LAB — T3: T3 TOTAL: 105 ng/dL (ref 71–180)

## 2018-10-02 LAB — FOLATE: Folate: 14.2 ng/mL (ref >3.0)

## 2018-10-02 LAB — VITAMIN D 25 HYDROXY (VIT D DEFICIENCY, FRACTURES): Vit D, 25-Hydroxy: 37.9 ng/mL (ref 30.0–100.0)

## 2018-10-15 ENCOUNTER — Ambulatory Visit (INDEPENDENT_AMBULATORY_CARE_PROVIDER_SITE_OTHER): Payer: BC Managed Care – PPO | Admitting: Family Medicine

## 2018-10-15 ENCOUNTER — Encounter (INDEPENDENT_AMBULATORY_CARE_PROVIDER_SITE_OTHER): Payer: Self-pay | Admitting: Family Medicine

## 2018-10-15 VITALS — BP 117/80 | HR 64 | Temp 97.7°F | Ht 73.0 in | Wt 230.0 lb

## 2018-10-15 DIAGNOSIS — E559 Vitamin D deficiency, unspecified: Secondary | ICD-10-CM | POA: Diagnosis not present

## 2018-10-15 DIAGNOSIS — Z9189 Other specified personal risk factors, not elsewhere classified: Secondary | ICD-10-CM | POA: Diagnosis not present

## 2018-10-15 DIAGNOSIS — E8881 Metabolic syndrome: Secondary | ICD-10-CM

## 2018-10-15 DIAGNOSIS — Z6830 Body mass index (BMI) 30.0-30.9, adult: Secondary | ICD-10-CM

## 2018-10-15 DIAGNOSIS — E669 Obesity, unspecified: Secondary | ICD-10-CM | POA: Diagnosis not present

## 2018-10-15 MED ORDER — VITAMIN D (ERGOCALCIFEROL) 1.25 MG (50000 UNIT) PO CAPS: 50000.0000 [IU] | ORAL_CAPSULE | ORAL | 0 refills | Status: AC

## 2018-10-15 NOTE — Progress Notes (Signed)
Office: [phone]  /  Fax: [phone]   HPI:   Chief Complaint: OBESITY James Fernandez is here to discuss his progress with his obesity treatment plan. He is keeping a food journal with 1400 to 1500 calories and 85+ grams of protein and is following his eating plan approximately 100 % of the time. He states he is exercising 0 minutes 0 times per week. James Fernandez did very well with journaling. He kept his calories close to range and states that he did well meeting his protein goal.  His weight is 230 lb (104.3 kg) today and has had a weight loss of 6 pounds over a period of 2 weeks since his last visit. He has lost 6 lbs since starting treatment with [place].  Vitamin D Deficiency James Fernandez has a diagnosis of vitamin D deficiency. He is currently on a multivitamin, but his vitamin D is not yet at goal. James Fernandez admits fatigue and denies nausea, vomiting, or muscle weakness.  Insulin Resistance James Fernandez has a new diagnosis of insulin resistance based on his elevated fasting insulin level >5 with a normal A1c and glucose. Although James Fernandez's blood glucose readings are still under good control, insulin resistance puts him at greater risk of metabolic syndrome and diabetes. He had been eating a higher carb plan previously. He continues to work on diet and exercise to decrease risk of diabetes.  At risk for diabetes James Fernandez is at higher than average risk for developing diabetes due to his insulin resistance and obesity. He currently denies polyuria or polydipsia.  ASSESSMENT AND PLAN:  Vitamin D deficiency - Plan: Vitamin D, Ergocalciferol, (DRISDOL) 1.25 MG (50000 UT) CAPS capsule  Insulin resistance  At risk for diabetes mellitus  Class 1 obesity with serious comorbidity and body mass index (BMI) of 30.0 to 30.9 in adult, unspecified obesity type  PLAN:  Vitamin D Deficiency James Fernandez was informed that low vitamin D levels contributes to fatigue and are associated with obesity, breast, and colon  cancer. James Fernandez agrees to start to take prescription Vit D ,000 IU every week #4 with no refills and continue his multivitamin. He will follow up for routine testing of vitamin D, at least 2-3 times per year. He was informed of the risk of over-replacement of vitamin D and agrees to not increase her dose unless she discusses this with [place] first. We will recheck labs in 3 months and James Fernandez agrees to follow up in 2 to 3 weeks as directed.  Insulin Resistance James Fernandez will continue to work on weight loss, exercise, and decreasing simple carbohydrates in his diet to help decrease the risk of diabetes. We discussed metformin including benefits and risks. He was informed that eating too many simple carbohydrates or too many calories at one sitting increases the likelihood of GI side effects. James Fernandez deferred metformin for now and prescription was not written today. James Fernandez agreed to continue journaling and decreasing simple carbs. He will follow up with [place] as directed to monitor his progress.  Diabetes risk counseling James Fernandez was given extended (15 minutes) diabetes prevention counseling today. He is 63 y.o. male and has risk factors for diabetes including insulin resistance and obesity. We discussed intensive lifestyle modifications today with an emphasis on weight loss as well as increasing exercise and decreasing simple carbohydrates in his diet.  Obesity James Fernandez is currently in the action stage of change. As such, his goal is to continue with weight loss efforts. He has agreed to keep a food journal with 1400 to 1500 calories and 85+  grams of protein.  James Fernandez has been instructed to work up to a goal of 150 minutes of combined cardio and strengthening exercise per week for weight loss and overall health benefits. We discussed the following Behavioral Modification Strategies today: decreasing simple carbohydrates, better snacking choices, keeping a strict food journal, and work on meal planning and easy  cooking plans.  James Fernandez has agreed to follow up with our clinic in 2 to 3 weeks. He was informed of the importance of frequent follow up visits to maximize his success with intensive lifestyle modifications for his multiple health conditions.  ALLERGIES: Allergies  Allergen Reactions  . Demerol [Meperidine]     BP bottom out    MEDICATIONS: Current Outpatient Medications on File Prior to Visit  Medication Sig Dispense Refill  . Multiple Vitamin (MULTIVITAMIN WITH MINERALS) TABS tablet Take 1 tablet by mouth daily.    Marland Kitchen omeprazole (PRILOSEC) 20 MG capsule Take 20 mg by mouth daily.     No current facility-administered medications on file prior to visit.     PAST MEDICAL HISTORY: Past Medical History:  Diagnosis Date  . Back pain   . Chest pain   . Esophageal reflux   . Family history of coronary arteriosclerosis 05/31/2015  . GERD (gastroesophageal reflux disease) 05/31/2015  . Joint pain   . Knee pain   . Lower extremity edema   . OSA (obstructive sleep apnea) 02/10/10   ESS 18, AHI 23/hr, RDI 29/hr, no REM, O2 nadir 81%; CPAP 9 with AHI 0/hr.  . Right calf pain   . Seasonal allergies   . Vitamin B 12 deficiency     PAST SURGICAL HISTORY: Past Surgical History:  Procedure Laterality Date  . KNEE SURGERY     05/1975, 09/1975  . NOSE SURGERY     11/1973, 10/1979    SOCIAL HISTORY: Social History   Tobacco Use  . Smoking status: Never Smoker  . Smokeless tobacco: Never Used  Substance Use Topics  . Alcohol use: No    Alcohol/week: 0.0 standard drinks  . Drug use: No    FAMILY HISTORY: Family History  Problem Relation Age of Onset  . Heart failure Mother 28  . Anxiety disorder Mother   . Heart failure Maternal Grandmother   . Prostate cancer Maternal Grandfather   . Stroke Maternal Grandfather        35  . Heart attack Neg Hx   . Hypertension Neg Hx     ROS: Review of Systems  Constitutional: Positive for malaise/fatigue and weight loss.    Gastrointestinal: Negative for nausea and vomiting.  Genitourinary:       Negative for polyuria.  Musculoskeletal:       Negative for muscle weakness.  Endo/Heme/Allergies: Negative for polydipsia.   PHYSICAL EXAM: Blood pressure 117/80, pulse 64, temperature 97.7 F (36.5 C), height  (1.854 m), weight 230 lb (104.3 kg), SpO2 98 %. Body mass index is 30.34 kg/m. Physical Exam Vitals signs reviewed.  Constitutional:      Appearance: Normal appearance. He is obese.  Cardiovascular:     Rate and Rhythm: Normal rate.  Pulmonary:     Effort: Pulmonary effort is normal.  Musculoskeletal: Normal range of motion.  Skin:    General: Skin is warm and dry.  Neurological:     Mental Status: He is alert and oriented to person, place, and time.  Psychiatric:        Mood and Affect: Mood normal.  Behavior: Behavior normal.    RECENT LABS AND TESTS: BMET    Component Value Date/Time   NA 141 10/01/2018 1242   K 5.4 (H) 10/01/2018 1242   CL 102 10/01/2018 1242   CO2 24 10/01/2018 1242   GLUCOSE 83 10/01/2018 1242   GLUCOSE 122 (H) 09/23/2018 0806   BUN 11 10/01/2018 1242   CREATININE 0.87 10/01/2018 1242   CALCIUM 9.3 10/01/2018 1242   GFRNONAA 93 10/01/2018 1242   GFRAA 108 10/01/2018 1242   Lab Results  Component Value Date   HGBA1C 5.4 10/01/2018   Lab Results  Component Value Date   INSULIN 11.9 10/01/2018   CBC    Component Value Date/Time   WBC 5.8 10/01/2018 1242   WBC 14.1 (H) 09/23/2018 0806   RBC 5.45 10/01/2018 1242   RBC 5.56 09/23/2018 0806   HGB 16.1 10/01/2018 1242   HCT 48.3 10/01/2018 1242   PLT 212 09/23/2018 0806   MCV 89 10/01/2018 1242   MCH 29.5 10/01/2018 1242   MCH 29.7 09/23/2018 0806   MCHC 33.3 10/01/2018 1242   MCHC 32.5 09/23/2018 0806   RDW 12.5 10/01/2018 1242   LYMPHSABS 1.7 10/01/2018 1242   MONOABS 0.5 01/08/2010 1510   EOSABS 0.1 10/01/2018 1242   BASOSABS 0.0 10/01/2018 1242   Iron/TIBC/Ferritin/ %Sat No  results found for: IRON, TIBC, FERRITIN, IRONPCTSAT Lipid Panel     Component Value Date/Time   CHOL 142 10/01/2018 1242   TRIG 115 10/01/2018 1242   HDL 27 (L) 10/01/2018 1242   LDLCALC 92 10/01/2018 1242   Hepatic Function Panel     Component Value Date/Time   PROT 7.9 10/01/2018 1242   ALBUMIN 4.4 10/01/2018 1242   AST 21 10/01/2018 1242   ALT 25 10/01/2018 1242   ALKPHOS 103 10/01/2018 1242   BILITOT 0.9 10/01/2018 1242      Component Value Date/Time   TSH 1.240 10/01/2018 1242   Results for EIRIK, TINKER (MRN [id number]) as of 10/15/2018 09:56  Ref. Range 10/01/2018 12:42  Vitamin D, 25-Hydroxy Latest Ref Range: 30.0 - 100.0 ng/mL 37.9   OBESITY BEHAVIORAL INTERVENTION VISIT  Today's visit was # 2   Starting weight: 236 lbs Starting date: 10/01/18 Today's weight : Weight: 230 lb (104.3 kg)  Today's date: 10/15/2018 Total lbs lost to date: 6    10/15/2018  Height 6\' 1"  (1.854 m)  Weight 230 lb (104.3 kg)  BMI (Calculated) 30.35  BLOOD PRESSURE - SYSTOLIC 117  BLOOD PRESSURE - DIASTOLIC 80   Body Fat % 28 %  Total Body Water (lbs) 115 lbs   ASK: We discussed the diagnosis of obesity with Estill Bamberg today and Wilhelm agreed to give [place] permission to discuss obesity behavioral modification therapy today.  ASSESS: Srinivas has the diagnosis of obesity and his BMI today is 30.35. Saket is in the action stage of change.   ADVISE: Colwyn was educated on the multiple health risks of obesity as well as the benefit of weight loss to improve his health. He was advised of the need for long term treatment and the importance of lifestyle modifications to improve his current health and to decrease his risk of future health problems.  AGREE: Multiple dietary modification options and treatment options were discussed and James Fernandez agreed to follow the recommendations documented in the above note.  ARRANGE: Etan was educated on the importance of frequent visits to treat  obesity as outlined per CMS and USPSTF guidelines and agreed to schedule his  next follow up appointment today.  IKirke Corin, CMA, am acting as transcriptionist for Wilder Glade, MD  I have reviewed the above documentation for accuracy and completeness, and I agree with the above. -Quillian Quince, MD

## 2018-10-30 ENCOUNTER — Encounter (INDEPENDENT_AMBULATORY_CARE_PROVIDER_SITE_OTHER): Payer: Self-pay

## 2018-11-05 ENCOUNTER — Ambulatory Visit (INDEPENDENT_AMBULATORY_CARE_PROVIDER_SITE_OTHER): Payer: BC Managed Care – PPO | Admitting: Family Medicine

## 2018-11-13 ENCOUNTER — Other Ambulatory Visit (INDEPENDENT_AMBULATORY_CARE_PROVIDER_SITE_OTHER): Payer: Self-pay | Admitting: Family Medicine

## 2018-11-13 ENCOUNTER — Encounter (INDEPENDENT_AMBULATORY_CARE_PROVIDER_SITE_OTHER): Payer: Self-pay

## 2018-11-13 DIAGNOSIS — E559 Vitamin D deficiency, unspecified: Secondary | ICD-10-CM

## 2019-10-04 IMAGING — CR DG ABDOMEN ACUTE W/ 1V CHEST
4 series · 4 of 4 positions shown · non-contrast
Comparison: CT abdomen and pelvis 01/28/2010.

CLINICAL DATA: Diffuse abdominal pain. Intractable nausea and
vomiting for 1 day.

EXAM:
DG ABDOMEN ACUTE W/ 1V CHEST

[w chest pa]
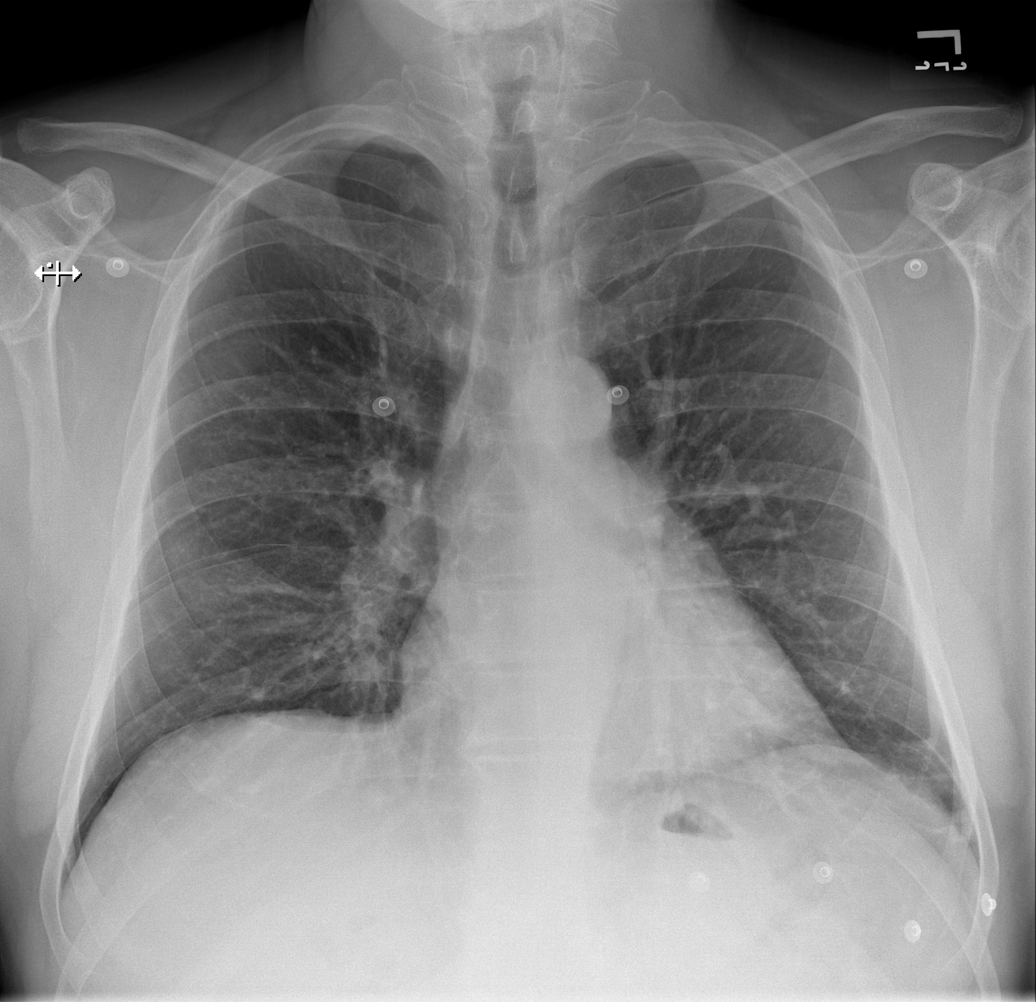

[w abdomen upright]
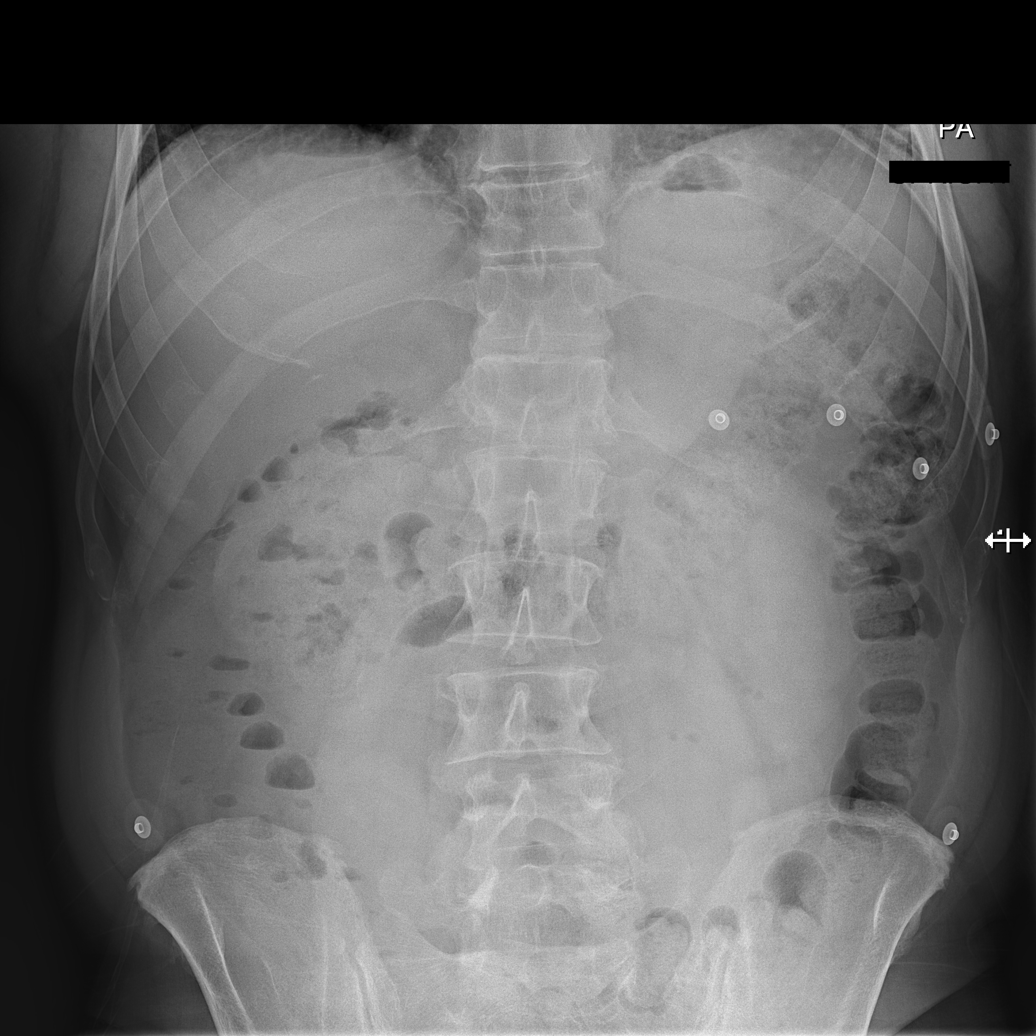

[t abdomen supine (1 of 2)]
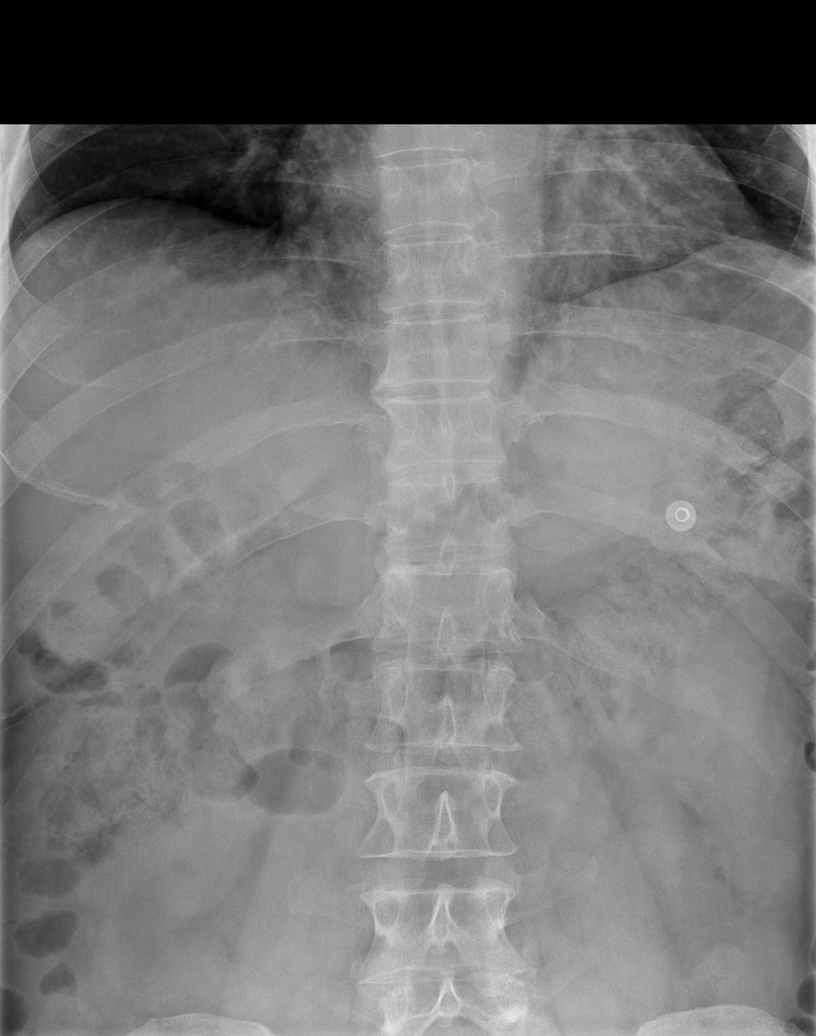

[t abdomen supine (2 of 2)]
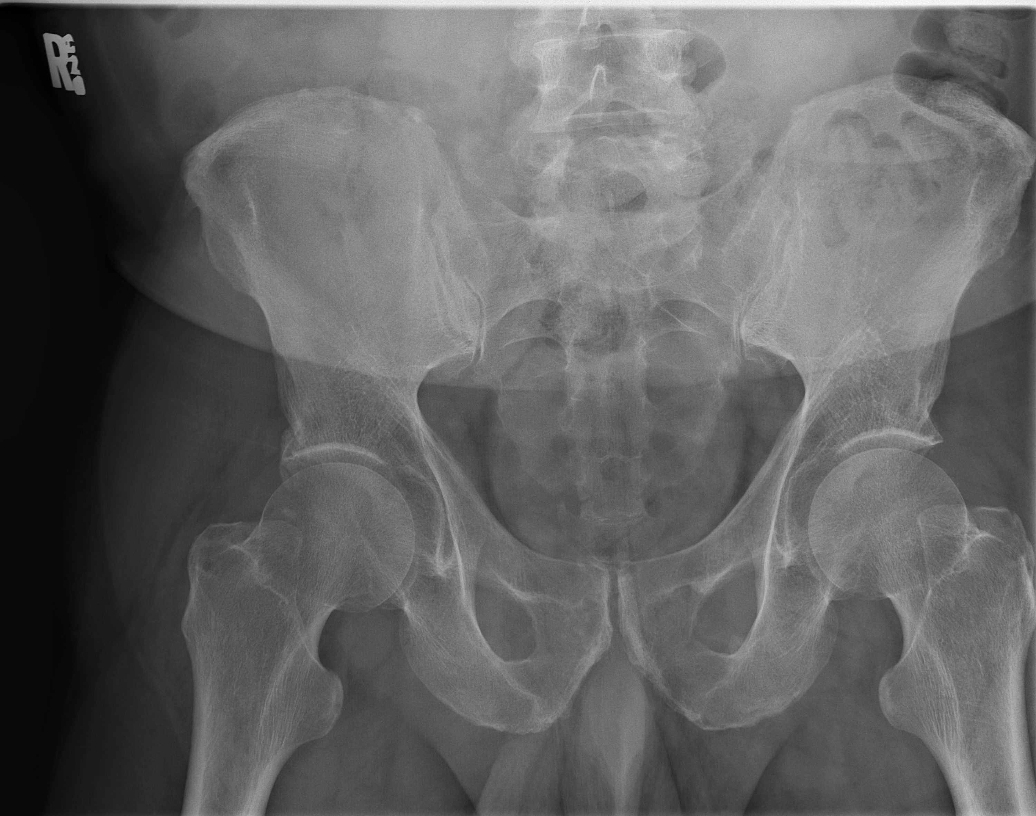

[4 of 4 positions shown; findings below may reference images not displayed]

FINDINGS: Heart size is normal. Mild left basilar airspace disease is present.
Lungs are otherwise clear. There is no edema or effusion.

Bowel gas pattern is normal.  No obstruction or free air is present.

There is partial sacralization of L5. Axial skeleton is otherwise
within normal limits.
IMPRESSION: 1. Mild left basilar airspace disease is present. This likely
reflects atelectasis.
2. Normal bowel gas pattern. No acute or focal abnormality to
explain the patient's abdominal pain.

## 2019-10-10 ENCOUNTER — Ambulatory Visit: Payer: BC Managed Care – PPO | Attending: Internal Medicine

## 2019-10-10 DIAGNOSIS — Z23 Encounter for immunization: Secondary | ICD-10-CM

## 2019-10-10 NOTE — Progress Notes (Signed)
   Covid-19 Vaccination Clinic  Name:  James Fernandez    MRN: [id number] DOB: Jun 10, 1956  10/10/2019  James Fernandez was observed post Covid-19 immunization for 15 minutes without incident. He was provided with Vaccine Information Sheet and instruction to access the V-Safe system.   James Fernandez was instructed to call 911 with any severe reactions post vaccine: Marland Kitchen Difficulty breathing  . Swelling of face and throat  . A fast heartbeat  . A bad rash all over body  . Dizziness and weakness   Immunizations Administered    Name Date Dose VIS Date Route   Pfizer COVID-19 Vaccine 10/10/2019  1:38 PM 0.3 mL 07/18/2019 Intramuscular   Manufacturer: [employer], [employer]   Lot: [id number]   NDC: [address]-7

## 2019-11-05 ENCOUNTER — Ambulatory Visit: Payer: BC Managed Care – PPO | Attending: Internal Medicine

## 2019-11-05 DIAGNOSIS — Z23 Encounter for immunization: Secondary | ICD-10-CM

## 2019-11-05 NOTE — Progress Notes (Signed)
   Covid-19 Vaccination Clinic  Name:  [name]    MRN: [id number] DOB: July 31, 1956  11/05/2019  Mr. Espana was observed post Covid-19 immunization for 15 minutes without incident. He was provided with Vaccine Information Sheet and instruction to access the V-Safe system.   Mr. Kawano was instructed to call 911 with any severe reactions post vaccine: Marland Kitchen Difficulty breathing  . Swelling of face and throat  . A fast heartbeat  . A bad rash all over body  . Dizziness and weakness   Immunizations Administered    Name Date Dose VIS Date Route   Pfizer COVID-19 Vaccine 11/05/2019  1:55 PM 0.3 mL 07/18/2019 Intramuscular   Manufacturer: [employer], [employer]   Lot: [id number]   NDC: [address]-2

## 2021-01-16 ENCOUNTER — Emergency Department (HOSPITAL_BASED_OUTPATIENT_CLINIC_OR_DEPARTMENT_OTHER): Payer: BC Managed Care – PPO | Admitting: Radiology

## 2021-01-16 ENCOUNTER — Emergency Department (HOSPITAL_BASED_OUTPATIENT_CLINIC_OR_DEPARTMENT_OTHER)
Admission: EM | Admit: 2021-01-16 | Discharge: 2021-01-16 | Disposition: A | Discharge status: Home / Self Care | Payer: BC Managed Care – PPO | Attending: Emergency Medicine | Admitting: Emergency Medicine

## 2021-01-16 ENCOUNTER — Other Ambulatory Visit: Payer: Self-pay

## 2021-01-16 ENCOUNTER — Encounter (HOSPITAL_BASED_OUTPATIENT_CLINIC_OR_DEPARTMENT_OTHER): Payer: Self-pay | Admitting: *Deleted

## 2021-01-16 DIAGNOSIS — R059 Cough, unspecified: Secondary | ICD-10-CM

## 2021-01-16 DIAGNOSIS — Z20822 Contact with and (suspected) exposure to covid-19: Secondary | ICD-10-CM | POA: Diagnosis not present

## 2021-01-16 DIAGNOSIS — R030 Elevated blood-pressure reading, without diagnosis of hypertension: Secondary | ICD-10-CM

## 2021-01-16 DIAGNOSIS — R509 Fever, unspecified: Secondary | ICD-10-CM | POA: Insufficient documentation

## 2021-01-16 LAB — RESP PANEL BY RT-PCR (FLU A&B, COVID) ARPGX2
Influenza A by PCR: NEGATIVE
Influenza B by PCR: NEGATIVE
SARS Coronavirus 2 by RT PCR: NEGATIVE

## 2021-01-16 MED ORDER — AZITHROMYCIN 250 MG PO TABS: 250.0000 mg | ORAL_TABLET | Freq: Every day | ORAL | 0 refills | Status: AC

## 2021-01-16 MED ORDER — AZITHROMYCIN 250 MG PO TABS
500.0000 mg | ORAL_TABLET | Freq: Once | ORAL | Status: AC
  Administered 2021-01-16: 500 mg via ORAL
  Filled 2021-01-16: qty 2

## 2021-01-16 NOTE — ED Triage Notes (Signed)
C/o cough and upper back pain that started 10 days ago,but worse. Fever on Thursday night. Took a covid test at home yesterday that was negative. States he is sob with coughing episodes. 02 sats 96% on RA. C/o right anterior chest pain as well.

## 2021-01-16 NOTE — Discharge Instructions (Addendum)
It was our pleasure to provide your ER care today - we hope that you feel better.  Take antibiotic as prescribed.  You may also try mucinex or nyquil as need for symptom relief.   Your covid/flu test should be resulted in the next few hours - you may check MyChart or call for results.   Follow up with primary care doctor in the coming week if symptoms fail to improve/resolve.   Also follow up with primary care doctor  in the next 1-2 weeks for recheck of blood pressure, as it is high today.   Return to ER if worse, new symptoms, increased trouble breathing, chest pain, or other concern.

## 2021-01-16 NOTE — ED Provider Notes (Signed)
[facility] EMERGENCY DEPT Provider Note   CSN: [id number] Arrival date & time: 01/16/21  2026     History Chief Complaint  Patient presents with   Cough    James Fernandez is a 65 y.o. male.  Patient c/o cough, occasionally productive of phlegm, in the past 9-10 days. Symptoms acute onset, moderate, constant, persistent. States w coughing spells, soreness across bilateral ribs/back. Denies anterior pain/chest pain. No sob. No sore throat or trouble swallowing. No specific known ill contacts or known covid exposure - took home test and was negative. States fever to 100. No abd pain or nvd. No dysuria or gu c/o. No rash.   The history is provided by the patient.  Cough Associated symptoms: fever   Associated symptoms: no chest pain, no headaches, no rash, no shortness of breath and no sore throat       Past Medical History:  Diagnosis Date   Back pain    Chest pain    Esophageal reflux    Family history of coronary arteriosclerosis 05/31/2015   GERD (gastroesophageal reflux disease) 05/31/2015   Joint pain    Knee pain    Lower extremity edema    OSA (obstructive sleep apnea) 02/10/10   ESS 18, AHI 23/hr, RDI 29/hr, no REM, O2 nadir 81%; CPAP 9 with AHI 0/hr.   Right calf pain    Seasonal allergies    Vitamin B 12 deficiency     Patient Active Problem List   Diagnosis Date Noted   Esophageal reflux    Seasonal allergies    Knee pain    Chest pain    Right calf pain    GERD (gastroesophageal reflux disease) 05/31/2015   Family history of coronary arteriosclerosis 05/31/2015   OSA (obstructive sleep apnea) 02/10/2010    Past Surgical History:  Procedure Laterality Date   KNEE SURGERY     05/1975, 09/1975   NOSE SURGERY     11/1973, 10/1979       Family History  Problem Relation Age of Onset   Heart failure Mother 59   Anxiety disorder Mother    Heart failure Maternal Grandmother    Prostate cancer Maternal Grandfather    Stroke Maternal  Grandfather        30   Heart attack Neg Hx    Hypertension Neg Hx     Social History   Tobacco Use   Smoking status: Never   Smokeless tobacco: Never  Substance Use Topics   Alcohol use: No    Alcohol/week: 0.0 standard drinks   Drug use: No    Home Medications Prior to Admission medications   Medication Sig Start Date End Date Taking? Authorizing Provider  Multiple Vitamin (MULTIVITAMIN WITH MINERALS) TABS tablet Take 1 tablet by mouth daily.    [provider]  omeprazole (PRILOSEC) 20 MG capsule Take 20 mg by mouth daily.    [provider]  Vitamin D, Ergocalciferol, (DRISDOL) 1.25 MG (50000 UT) CAPS capsule Take 1 capsule (50,000 Units total) by mouth every 7 (seven) days. 10/15/18   Wilder Glade, MD    Allergies    Demerol [meperidine]  Review of Systems   Review of Systems  Constitutional:  Positive for fever.  HENT:  Negative for sore throat.   Eyes:  Negative for redness.  Respiratory:  Positive for cough. Negative for shortness of breath.   Cardiovascular:  Negative for chest pain and leg swelling.  Gastrointestinal:  Negative for abdominal pain, diarrhea and  vomiting.  Genitourinary:  Negative for dysuria and flank pain.  Musculoskeletal:  Negative for neck pain and neck stiffness.  Skin:  Negative for rash.  Neurological:  Negative for headaches.  Hematological:  Does not bruise/bleed easily.  Psychiatric/Behavioral:  Negative for confusion.    Physical Exam Updated Vital Signs BP (!) 146/108 (BP Location: Right Arm)   Pulse (!) 105   Temp 99.8 F (37.7 C) (Oral)   Resp 20   Ht 1.854 m (6\' 1" )   Wt 114.7 kg   SpO2 96%   BMI 33.35 kg/m   Physical Exam Vitals and nursing note reviewed.  Constitutional:      Appearance: Normal appearance. He is well-developed.  HENT:     Head: Atraumatic.     Nose: Nose normal.     Mouth/Throat:     Mouth: Mucous membranes are moist.     Pharynx: Oropharynx is clear.  Eyes:     General:  No scleral icterus.    Conjunctiva/sclera: Conjunctivae normal.  Neck:     Trachea: No tracheal deviation.     Comments: No stiffness or rigidity.  Cardiovascular:     Rate and Rhythm: Normal rate and regular rhythm.     Pulses: Normal pulses.     Heart sounds: Normal heart sounds. No murmur heard.   No friction rub. No gallop.  Pulmonary:     Effort: Pulmonary effort is normal. No accessory muscle usage or respiratory distress.     Breath sounds: Normal breath sounds.  Abdominal:     General: Bowel sounds are normal. There is no distension.     Palpations: Abdomen is soft.     Tenderness: There is no abdominal tenderness.  Genitourinary:    Comments: No cva tenderness. Musculoskeletal:        General: No swelling or tenderness.     Cervical back: Normal range of motion and neck supple. No rigidity.     Right lower leg: No edema.     Left lower leg: No edema.  Skin:    General: Skin is warm and dry.     Findings: No rash.  Neurological:     Mental Status: He is alert.     Comments: Alert, speech clear.   Psychiatric:        Mood and Affect: Mood normal.    ED Results / Procedures / Treatments   Labs (all labs ordered are listed, but only abnormal results are displayed) Labs Reviewed  RESP PANEL BY RT-PCR (FLU A&B, COVID) [id number]    EKG None  Radiology DG Chest 2 View  Result Date: 01/16/2021 CLINICAL DATA:  Cough and back pain starting 10 days ago. Fever on Thursday. EXAM: CHEST - 2 VIEW COMPARISON:  12/17/2007 FINDINGS: Slightly shallow inspiration. Linear atelectasis in the lung bases. No airspace disease or consolidation. No pleural effusions. No pneumothorax. Heart size and pulmonary vascularity are normal. Mediastinal contours appear intact. IMPRESSION: Slightly shallow inspiration with mild linear atelectasis in the lung bases. Electronically Signed   By: 02/16/2008 M.D.   On: 01/16/2021 21:27    Procedures Procedures   Medications Ordered in  ED Medications - No data to display  ED Course  I have reviewed the triage vital signs and the nursing notes.  Pertinent labs & imaging results that were available during my care of the patient were reviewed by me and considered in my medical decision making (see chart for details).    MDM Rules/Calculators/A&P  Cxr. Labs.   Reviewed nursing notes and prior charts for additional history.   COVID/flu swab remains pending.   CXR reviewed/interpreted by me - no def pna.   Diff dx, bronchitis, early pna, covid, flu, etc.    Zithromax po. Rx for home.   F/u on covid/flu result.   Rec pcp f/u.  Return precautions.       Final Clinical Impression(s) / ED Diagnoses Final diagnoses:  None    Rx / DC Orders ED Discharge Orders     None        Cathren Laine, MD 01/16/21 2137

## 2021-01-22 ENCOUNTER — Encounter (HOSPITAL_COMMUNITY): Payer: Self-pay

## 2021-01-22 ENCOUNTER — Ambulatory Visit (HOSPITAL_COMMUNITY)
Admission: EM | Admit: 2021-01-22 | Discharge: 2021-01-22 | Disposition: A | Discharge status: Home / Self Care | Payer: BC Managed Care – PPO | Attending: Student | Admitting: Student

## 2021-01-22 DIAGNOSIS — Z8701 Personal history of pneumonia (recurrent): Secondary | ICD-10-CM | POA: Diagnosis not present

## 2021-01-22 DIAGNOSIS — R059 Cough, unspecified: Secondary | ICD-10-CM

## 2021-01-22 DIAGNOSIS — H109 Unspecified conjunctivitis: Secondary | ICD-10-CM | POA: Diagnosis not present

## 2021-01-22 DIAGNOSIS — R030 Elevated blood-pressure reading, without diagnosis of hypertension: Secondary | ICD-10-CM

## 2021-01-22 DIAGNOSIS — J069 Acute upper respiratory infection, unspecified: Secondary | ICD-10-CM

## 2021-01-22 MED ORDER — POLYMYXIN B-TRIMETHOPRIM 10000-0.1 UNIT/ML-% OP SOLN: 1.0000 [drp] | OPHTHALMIC | 0 refills | Status: AC

## 2021-01-22 NOTE — ED Provider Notes (Signed)
[facility]    CSN: [id number] Arrival date & time: 01/22/21  1316      History   Chief Complaint Chief Complaint  Patient presents with   Eye Pain    HPI James Fernandez is a 65 y.o. male presenting with L eye irritation.  Medical history GERD.  Was also diagnosed with pneumonia 01/16/21 and treated with z-pack at that time which he has completed with some improvement.  Also states he is concerned about his blood pressure reading today, he does not take any blood pressure medications, he denies chest pain, dizziness, weakness, headaches.  Patient is here today for his left eye irritation, states that he was walking outside when he felt a gnat fly in his left eye.  This was removed easily, but since then he has endorsed redness, clear discharge, yellow crusting in the morning, itching, blurred vision.  Over-the-counter eyedrops providing minimal relief.  Denies photophobia, foreign body sensation, eye pain, eye pain with movement, injury to eye, double vision, flashes of light in field of vision.  He is followed by Dr. Elmer Picker, ophthalmology, but has not followed with them for a few years.  Wears reading glasses but does not wear glasses for distance vision.  Does not wear contacts.   HPI  Past Medical History:  Diagnosis Date   Back pain    Chest pain    Esophageal reflux    Family history of coronary arteriosclerosis 05/31/2015   GERD (gastroesophageal reflux disease) 05/31/2015   Joint pain    Knee pain    Lower extremity edema    OSA (obstructive sleep apnea) 02/10/10   ESS 18, AHI 23/hr, RDI 29/hr, no REM, O2 nadir 81%; CPAP 9 with AHI 0/hr.   Right calf pain    Seasonal allergies    Vitamin B 12 deficiency     Patient Active Problem List   Diagnosis Date Noted   Esophageal reflux    Seasonal allergies    Knee pain    Chest pain    Right calf pain    GERD (gastroesophageal reflux disease) 05/31/2015   Family history of coronary arteriosclerosis 05/31/2015    OSA (obstructive sleep apnea) 02/10/2010    Past Surgical History:  Procedure Laterality Date   KNEE SURGERY     05/1975, 09/1975   NOSE SURGERY     11/1973, 10/1979       Home Medications    Prior to Admission medications   Medication Sig Start Date End Date Taking? Authorizing Provider  trimethoprim-polymyxin b (POLYTRIM) ophthalmic solution Place 1 drop into both eyes every 4 (four) hours for 7 days. Every 4 hours while awake for 7 days 01/22/21 01/29/21 Yes Rhys Martini, PA-C  Multiple Vitamin (MULTIVITAMIN WITH MINERALS) TABS tablet Take 1 tablet by mouth daily.    [provider]  omeprazole (PRILOSEC) 20 MG capsule Take 20 mg by mouth daily.    [provider]  Vitamin D, Ergocalciferol, (DRISDOL) 1.25 MG (50000 UT) CAPS capsule Take 1 capsule (50,000 Units total) by mouth every 7 (seven) days. 10/15/18   Quillian Quince D, MD    Family History Family History  Problem Relation Age of Onset   Heart failure Mother 66   Anxiety disorder Mother    Heart failure Maternal Grandmother    Prostate cancer Maternal Grandfather    Stroke Maternal Grandfather        8   Heart attack Neg Hx    Hypertension Neg Hx  Social History Social History   Tobacco Use   Smoking status: Never   Smokeless tobacco: Never  Substance Use Topics   Alcohol use: No    Alcohol/week: 0.0 standard drinks   Drug use: No     Allergies   Demerol [meperidine]   Review of Systems Review of Systems  Eyes:  Positive for discharge, redness and itching. Negative for photophobia, pain and visual disturbance.  All other systems reviewed and are negative.   Physical Exam Triage Vital Signs ED Triage Vitals  Enc Vitals Group     BP 01/22/21 1405 (!) 161/89     Pulse Rate 01/22/21 1405 89     Resp 01/22/21 1405 18     Temp 01/22/21 1405 98.5 F (36.9 C)     Temp Source 01/22/21 1405 Oral     SpO2 01/22/21 1405 95 %     Weight --      Height --      Head Circumference  --      Peak Flow --      Pain Score 01/22/21 1408 4     Pain Loc --      Pain Edu? --      Excl. in GC? --    No data found.  Updated Vital Signs BP (!) 161/89 (BP Location: Left Arm)   Pulse 89   Temp 98.5 F (36.9 C) (Oral)   Resp 18   SpO2 95%   Visual Acuity Right Eye Distance: 20/15 (without correction) Left Eye Distance: 20/70 (without correction) Bilateral Distance: 20/13 (without correction)  Right Eye Near:   Left Eye Near:    Bilateral Near:     Physical Exam Vitals reviewed.  Constitutional:      General: He is not in acute distress.    Appearance: Normal appearance. He is not ill-appearing or diaphoretic.  HENT:     Head: Normocephalic and atraumatic.     Right Ear: Hearing, tympanic membrane, ear canal and external ear normal. No swelling or tenderness. There is no impacted cerumen. No mastoid tenderness. Tympanic membrane is not perforated, erythematous, retracted or bulging.     Left Ear: Hearing, tympanic membrane, ear canal and external ear normal. No swelling or tenderness. There is no impacted cerumen. No mastoid tenderness. Tympanic membrane is not perforated, erythematous, retracted or bulging.     Nose: Nose normal. No congestion.     Right Sinus: No maxillary sinus tenderness or frontal sinus tenderness.     Left Sinus: No maxillary sinus tenderness or frontal sinus tenderness.     Mouth/Throat:     Mouth: Mucous membranes are moist.     Pharynx: Oropharynx is clear. Uvula midline. No oropharyngeal exudate or posterior oropharyngeal erythema.     Tonsils: No tonsillar exudate.  Eyes:     General: Lids are normal. Lids are everted, no foreign bodies appreciated. Vision grossly intact. Gaze aligned appropriately. No visual field deficit.       Right eye: No foreign body, discharge or hordeolum.        Left eye: Discharge present.No foreign body or hordeolum.     Extraocular Movements: Extraocular movements intact.     Right eye: Normal extraocular  motion and no nystagmus.     Left eye: Normal extraocular motion and no nystagmus.     Conjunctiva/sclera:     Right eye: Right conjunctiva is not injected. No chemosis, exudate or hemorrhage.    Left eye: Left conjunctiva is injected. No chemosis, exudate  or hemorrhage.    Pupils: Pupils are equal, round, and reactive to light.     Visual Fields: Right eye visual fields normal and left eye visual fields normal.     Comments: PERRLA, EOMI without pain. No orbital tenderness. Visual acuity intact.  Cardiovascular:     Rate and Rhythm: Normal rate and regular rhythm.     Pulses:          Radial pulses are 2+ on the right side and 2+ on the left side.     Heart sounds: Normal heart sounds.  Pulmonary:     Effort: Pulmonary effort is normal.     Breath sounds: Normal breath sounds and air entry. No wheezing, rhonchi or rales.     Comments: Occ cough Lungs clear to auscultation Chest:     Chest wall: No tenderness.  Abdominal:     General: Abdomen is flat. Bowel sounds are normal.     Palpations: Abdomen is soft.     Tenderness: There is no abdominal tenderness. There is no guarding or rebound.  Musculoskeletal:     Right lower leg: No edema.     Left lower leg: No edema.  Lymphadenopathy:     Cervical: No cervical adenopathy.  Skin:    General: Skin is warm.     Capillary Refill: Capillary refill takes less than 2 seconds.  Neurological:     General: No focal deficit present.     Mental Status: He is alert and oriented to person, place, and time.  Psychiatric:        Attention and Perception: Attention and perception normal.        Mood and Affect: Mood and affect normal.        Behavior: Behavior normal. Behavior is cooperative.        Thought Content: Thought content normal.        Judgment: Judgment normal.     UC Treatments / Results  Labs (all labs ordered are listed, but only abnormal results are displayed) Labs Reviewed - No data to display  EKG   Radiology No  results found.  Procedures Procedures (including critical care time)  Medications Ordered in UC Medications - No data to display  Initial Impression / Assessment and Plan / UC Course  I have reviewed the triage vital signs and the nursing notes.  Pertinent labs & imaging results that were available during my care of the patient were reviewed by me and considered in my medical decision making (see chart for details).     This patient is a 65 year old male presenting with bacterial conjunctivitis left eye. Visual acuity intact.  He was also recently treated for pneumonia,Today this pt is afebrile nontachycardic nontachypneic, oxygenating well on room air, no wheezes rhonchi or rales.   Recommended monitoring blood pressure at home or pharmacy.  Follow-up with primary care to address concerns related to this.  Polytrim drops as below.  Follow-up with ophthalmology if symptoms worsen or persist.  ED return precautions discussed. Patient verbalizes understanding and agreement.    Final Clinical Impressions(s) / UC Diagnoses   Final diagnoses:  Bacterial conjunctivitis of left eye  Elevated blood pressure reading without diagnosis of hypertension  Cough  Recent URI  History of bacterial pneumonia     Discharge Instructions      -Polytrim drops (trimethoprim-polymyxin B), 1 drop in each eye every 4 hours while awake for 7 days. -Follow-up with your eye doctor Dr. Elmer PickerHecker if new symptoms like vision  changes, vision loss, flashes of light in your field of vision -Follow-up with primary care provider to discuss other concerns -Please check your blood pressure at home or at the pharmacy. If this continues to be >140/90, follow-up with your primary care provider for further blood pressure management/ medication titration. If you develop chest pain, shortness of breath, vision changes, the worst headache of your life- head straight to the ED or call 911.      ED Prescriptions      Medication Sig Dispense Auth. Provider   trimethoprim-polymyxin b (POLYTRIM) ophthalmic solution Place 1 drop into both eyes every 4 (four) hours for 7 days. Every 4 hours while awake for 7 days 10 mL Rhys Martini, PA-C      PDMP not reviewed this encounter.   Rhys Martini, PA-C 01/22/21 [phone]

## 2021-01-22 NOTE — Discharge Instructions (Addendum)
-  Polytrim drops (trimethoprim-polymyxin B), 1 drop in each eye every 4 hours while awake for 7 days. -Follow-up with your eye doctor Dr. Elmer Picker if new symptoms like vision changes, vision loss, flashes of light in your field of vision -Follow-up with primary care provider to discuss other concerns -Please check your blood pressure at home or at the pharmacy. If this continues to be >140/90, follow-up with your primary care provider for further blood pressure management/ medication titration. If you develop chest pain, shortness of breath, vision changes, the worst headache of your life- head straight to the ED or call 911.

## 2021-01-22 NOTE — ED Triage Notes (Signed)
Three days ago, Pt reports that he was walking when he felt a gnat fly into his left eye. The following day his eye was swollen and closed so he flushed his eye without improvement. Today, he reports blurred vision in his left eye and onset of right eye discharge. Has been taking eyedrops without improvement.

## 2023-07-18 ENCOUNTER — Telehealth: Payer: Self-pay

## 2023-07-18 NOTE — Telephone Encounter (Signed)
Pt may be on Eliquis called to confirm. VM left for patient to call back and confirm if he is taking. If he is Please get the patient r/s for an OV with APP or provider as well as cancel his PV and colonoscopy until he is seen in office.   Thank you, PV

## 2023-07-25 ENCOUNTER — Other Ambulatory Visit: Payer: Self-pay

## 2023-07-25 ENCOUNTER — Ambulatory Visit: Payer: Medicare PPO | Admitting: Physician Assistant

## 2023-07-25 ENCOUNTER — Encounter: Payer: Self-pay | Admitting: Physician Assistant

## 2023-07-25 VITALS — BP 124/70 | HR 90 | Ht 73.0 in | Wt 257.0 lb

## 2023-07-25 DIAGNOSIS — Z86718 Personal history of other venous thrombosis and embolism: Secondary | ICD-10-CM

## 2023-07-25 DIAGNOSIS — Z01818 Encounter for other preprocedural examination: Secondary | ICD-10-CM

## 2023-07-25 DIAGNOSIS — Z7901 Long term (current) use of anticoagulants: Secondary | ICD-10-CM

## 2023-07-25 DIAGNOSIS — Z860100 Personal history of colon polyps, unspecified: Secondary | ICD-10-CM

## 2023-07-25 DIAGNOSIS — Z86711 Personal history of pulmonary embolism: Secondary | ICD-10-CM

## 2023-07-25 MED ORDER — NA SULFATE-K SULFATE-MG SULF 17.5-3.13-1.6 GM/177ML PO SOLN: 1.0000 | Freq: Once | ORAL | 0 refills | Status: AC

## 2023-07-25 NOTE — Progress Notes (Signed)
Chief Complaint: History of colon polyps, chronic anticoagulation  HPI:    James Fernandez is a 67 year old male with a past medical history as listed below including history of DVT and PE on Eliquis, previously known to Dr. Arlyce Dice, who was referred to me by Tracey Harries, MD for a history colon polyps and need for colonoscopy on chronic anticoagulation.    Today, the patient tells me that his last colonoscopy was in 2017 or 2018 at [facility].  He was told he had a few polyps.  He tells me he is overdue to have another.  On chronic Eliquis for history of PE/DVT initially diagnosed in 2022.  Did see his PCP recently with some leg pain and thought he may have another blood clot but they did a CT that ruled this out.  Patient is slightly leery of coming off of his blood thinner.  Denies any GI issues.    Denies fever, chills or weight loss.  Past Medical History:  Diagnosis Date   Back pain    Chest pain    Esophageal reflux    Family history of coronary arteriosclerosis 05/31/2015   GERD (gastroesophageal reflux disease) 05/31/2015   Joint pain    Knee pain    Lower extremity edema    OSA (obstructive sleep apnea) 02/10/10   ESS 18, AHI 23/hr, RDI 29/hr, no REM, O2 nadir 81%; CPAP 9 with AHI 0/hr.   Right calf pain    Seasonal allergies    Vitamin B 12 deficiency     Past Surgical History:  Procedure Laterality Date   KNEE SURGERY     05/1975, 09/1975   NOSE SURGERY     11/1973, 10/1979    Current Outpatient Medications  Medication Sig Dispense Refill   Multiple Vitamin (MULTIVITAMIN WITH MINERALS) TABS tablet Take 1 tablet by mouth daily.     omeprazole (PRILOSEC) 20 MG capsule Take 20 mg by mouth daily.     Vitamin D, Ergocalciferol, (DRISDOL) 1.25 MG (50000 UT) CAPS capsule Take 1 capsule (50,000 Units total) by mouth every 7 (seven) days. (Patient not taking: Reported on 07/25/2023) 4 capsule 0   No current facility-administered medications for this visit.    Allergies as of  07/25/2023 - Review Complete 07/25/2023  Allergen Reaction Noted   Demerol [meperidine]  05/25/2015    Family History  Problem Relation Age of Onset   Heart failure Mother 66   Anxiety disorder Mother    Heart failure Maternal Grandmother    Prostate cancer Maternal Grandfather    Stroke Maternal Grandfather        71   Heart attack Neg Hx    Hypertension Neg Hx     Social History   Socioeconomic History   Marital status: Married    Spouse name: Rourke Kase   Number of children: 2   Years of education: Not on file   Highest education level: Not on file  Occupational History   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   Occupation: Education officer, environmental  Tobacco Use   Smoking status: Never   Smokeless tobacco: Never  Vaping Use   Vaping status: Never Used  Substance and Sexual Activity   Alcohol use: No    Alcohol/week: 0.0 standard drinks of alcohol   Drug use: No   Sexual activity: Not on file  Other Topics Concern   Not on file  Social History Narrative   Not on file   Social Drivers of Corporate investment banker  Strain: Low Risk  (07/08/2023)   Received from [facility]   Overall Financial Resource Strain (CARDIA)    Difficulty of Paying Living Expenses: Not very hard  Food Insecurity: No Food Insecurity (07/08/2023)   Received from [facility]   Hunger Vital Sign    Worried About Running Out of Food in the Last Year: Never true    Ran Out of Food in the Last Year: Never true  Transportation Needs: No Transportation Needs (07/08/2023)   Received from [facility] - Transportation    Lack of Transportation (Medical): No    Lack of Transportation (Non-Medical): No  Physical Activity: Insufficiently Active (07/08/2023)   [facility]   Exercise Vital Sign    Days of Exercise per Week: 5 days    Minutes of Exercise per Session: 20 min  Stress: No Stress Concern Present (07/08/2023)   Received from [facility] of  Occupational Health - Occupational Stress Questionnaire    Feeling of Stress : Only a little  Social Connections: Moderately Integrated (07/08/2023)   Received from [facility]   Social Network    How would you rate your social network (family, work, friends)?: Adequate participation with social networks  Intimate Partner Violence: Not At Risk (07/08/2023)   Received from [facility]   HITS    Over the last 12 months how often did your partner physically hurt you?: Never    Over the last 12 months how often did your partner insult you or talk down to you?: Sometimes    Over the last 12 months how often did your partner threaten you with physical harm?: Never    Over the last 12 months how often did your partner scream or curse at you?: Rarely    Review of Systems:    Constitutional: No weight loss, fever or chills Skin: No rash  Cardiovascular: No chest pain  Respiratory: No SOB  Gastrointestinal: See HPI and otherwise negative Genitourinary: No dysuria  Neurological: No headache, dizziness or syncope Musculoskeletal: No new muscle or joint pain Hematologic: No bleeding  Psychiatric: No history of depression or anxiety   Physical Exam:  Vital signs: BP 124/70   Pulse 90   Ht 6\' 1"  (1.854 m)   Wt 257 lb (116.6 kg)   BMI 33.91 kg/m    Constitutional:   Pleasant Caucasian male appears to be in NAD, Well developed, Well nourished, alert and cooperative Head:  Normocephalic and atraumatic. Eyes:   PEERL, EOMI. No icterus. Conjunctiva pink. Ears:  Normal auditory acuity. Neck:  Supple Throat: Oral cavity and pharynx without inflammation, swelling or lesion.  Respiratory: Respirations even and unlabored. Lungs clear to auscultation bilaterally.   No wheezes, crackles, or rhonchi.  Cardiovascular: Normal S1, S2. No MRG. Regular rate and rhythm. No peripheral edema, cyanosis or pallor.  Gastrointestinal:  Soft, nondistended, nontender. No rebound or guarding. Normal bowel sounds.  No appreciable masses or hepatomegaly. Rectal:  Not performed.  Msk:  Symmetrical without gross deformities. Without edema, no deformity or joint abnormality.  Neurologic:  Alert and  oriented x4;  grossly normal neurologically.  Skin:   Dry and intact without significant lesions or rashes. Psychiatric:  Demonstrates good judgement and reason without abnormal affect or behaviors.  RELEVANT LABS AND IMAGING: CBC    Component Value Date/Time   WBC 5.8 10/01/2018 1242   WBC 14.1 (H) 09/23/2018 0806   RBC 5.45 10/01/2018 1242   RBC 5.56 09/23/2018  0806   HGB 16.1 10/01/2018 1242   HCT 48.3 10/01/2018 1242   PLT 212 09/23/2018 0806   MCV 89 10/01/2018 1242   MCH 29.5 10/01/2018 1242   MCH 29.7 09/23/2018 0806   MCHC 33.3 10/01/2018 1242   MCHC 32.5 09/23/2018 0806   RDW 12.5 10/01/2018 1242   LYMPHSABS 1.7 10/01/2018 1242   MONOABS 0.5 01/08/2010 1510   EOSABS 0.1 10/01/2018 1242   BASOSABS 0.0 10/01/2018 1242    CMP     Component Value Date/Time   NA 141 10/01/2018 1242   K 5.4 (H) 10/01/2018 1242   CL 102 10/01/2018 1242   CO2 24 10/01/2018 1242   GLUCOSE 83 10/01/2018 1242   GLUCOSE 122 (H) 09/23/2018 0806   BUN 11 10/01/2018 1242   CREATININE 0.87 10/01/2018 1242   CALCIUM 9.3 10/01/2018 1242   PROT 7.9 10/01/2018 1242   ALBUMIN 4.4 10/01/2018 1242   AST 21 10/01/2018 1242   ALT 25 10/01/2018 1242   ALKPHOS 103 10/01/2018 1242   BILITOT 0.9 10/01/2018 1242   GFRNONAA 93 10/01/2018 1242   GFRAA 108 10/01/2018 1242    Assessment: 1.  History of colon polyps: Apparently on patient's last colonoscopy in 2017/2018, we will request records today from [facility] 2.  History of DVT/PE on Eliquis  Plan: 1.  Patient is already scheduled for surveillance colonoscopy in the [facility] with Dr. Marina Goodell on January 8.  Did provide the patient a detailed list of risks for the procedure and he agrees to proceed. Patient is appropriate for endoscopic procedure(s) in the ambulatory ([facility])  setting.  2.  Patient was advised hold his Eliquis for 2 days prior to time of procedure.  We will communicate with his prescribing physician to ensure this is acceptable for him.  Did discuss that he is at slightly higher risk for clot formation around the time of his colonoscopy.  He agrees to proceed. 3.  Requesting records from [facility] in regards to last colonoscopy 3.  Patient to follow in clinic per recommendations after time of procedure.  Hyacinth Meeker, PA-C [facility] 07/25/2023, 11:08 AM  Cc: Tracey Harries, MD

## 2023-07-25 NOTE — Patient Instructions (Addendum)
You have been scheduled for a colonoscopy. Please follow written instructions given to you at your visit today.   Please pick up your prep supplies at the pharmacy within the next 1-3 days.  If you use inhalers (even only as needed), please bring them with you on the day of your procedure.  DO NOT TAKE 7 DAYS PRIOR TO TEST- Trulicity (dulaglutide) Ozempic, Wegovy (semaglutide) Mounjaro (tirzepatide) Bydureon Bcise (exanatide extended release)  DO NOT TAKE 1 DAY PRIOR TO YOUR TEST Rybelsus (semaglutide) Adlyxin (lixisenatide) Victoza (liraglutide) Byetta (exanatide) ___________________________________________________________________________   _______________________________________________________  If your blood pressure at your visit was 140/90 or greater, please contact your primary care physician to follow up on this.  _______________________________________________________  If you are age 19 or older, your body mass index should be between 23-30. Your Body mass index is 33.91 kg/m. If this is out of the aforementioned range listed, please consider follow up with your Primary Care Provider.  If you are age 6 or younger, your body mass index should be between 19-25. Your Body mass index is 33.91 kg/m. If this is out of the aformentioned range listed, please consider follow up with your Primary Care Provider.   ________________________________________________________  The [facility] providers would like to encourage you to use [facility] to communicate with providers for non-urgent requests or questions.  Due to long hold times on the telephone, sending your provider a message by [facility] may be a faster and more efficient way to get a response.  Please allow 48 business hours for a response.  Please remember that this is for non-urgent requests.  _______________________________________________________ It was a pleasure to see you today!  Thank you for trusting me with your  gastrointestinal care!

## 2023-08-02 NOTE — Progress Notes (Signed)
Noted. Based on available information, appears to be due (overdue) for surveillance colonoscopy.  Higher than average risk due to chronic anticoagulation state.  Management of anticoagulation per his supervising provider, regarding this problem.  Victorino Dike, Please obtain [facility] procedure reports and pathology and incorporate into his record. Thanks, Dr. Marina Goodell

## 2023-08-15 ENCOUNTER — Encounter: Payer: Self-pay | Admitting: Internal Medicine

## 2023-08-15 ENCOUNTER — Encounter: Payer: BC Managed Care – PPO | Admitting: Internal Medicine

## 2023-09-22 ENCOUNTER — Emergency Department (HOSPITAL_BASED_OUTPATIENT_CLINIC_OR_DEPARTMENT_OTHER)
Admission: EM | Admit: 2023-09-22 | Discharge: 2023-09-22 | Disposition: A | Discharge status: Home / Self Care | Payer: Medicare PPO | Attending: Emergency Medicine | Admitting: Emergency Medicine

## 2023-09-22 ENCOUNTER — Emergency Department (HOSPITAL_BASED_OUTPATIENT_CLINIC_OR_DEPARTMENT_OTHER): Payer: Medicare PPO | Admitting: Radiology

## 2023-09-22 ENCOUNTER — Other Ambulatory Visit: Payer: Self-pay

## 2023-09-22 ENCOUNTER — Encounter (HOSPITAL_BASED_OUTPATIENT_CLINIC_OR_DEPARTMENT_OTHER): Payer: Self-pay

## 2023-09-22 DIAGNOSIS — Z7901 Long term (current) use of anticoagulants: Secondary | ICD-10-CM | POA: Diagnosis not present

## 2023-09-22 DIAGNOSIS — J101 Influenza due to other identified influenza virus with other respiratory manifestations: Secondary | ICD-10-CM | POA: Diagnosis not present

## 2023-09-22 DIAGNOSIS — R059 Cough, unspecified: Secondary | ICD-10-CM | POA: Diagnosis present

## 2023-09-22 LAB — RESP PANEL BY RT-PCR (RSV, FLU A&B, COVID)  RVPGX2
Influenza A by PCR: POSITIVE — AB
Influenza B by PCR: NEGATIVE
Resp Syncytial Virus by PCR: NEGATIVE
SARS Coronavirus 2 by RT PCR: NEGATIVE

## 2023-09-22 MED ORDER — BENZONATATE 100 MG PO CAPS: 100.0000 mg | ORAL_CAPSULE | Freq: Three times a day (TID) | ORAL | 0 refills | Status: AC

## 2023-09-22 NOTE — Discharge Instructions (Addendum)
You have been seen today for your complaint of flu symptoms. Your lab work was positive for influenza A. Your imaging was reassuring to department. Your discharge medications include Tylenol.  You may take up to 1000 mg every 6 hours as needed for fevers and bodyaches. Tessalon Perles.  Take this as needed for your cough. Robitussin DM, Mucinex DM or Delsym for cough. Use Claritin and Flonase for congestion and runny nose.. Follow up with: Your primary care provider Please seek immediate medical care if you develop any of the following symptoms: You become short of breath or have trouble breathing. Your skin or nails turn blue. You have very bad pain or stiffness in your neck. You get a sudden headache or pain in your face or ear. You vomit each time you eat or drink. At this time there does not appear to be the presence of an emergent medical condition, however there is always the potential for conditions to change. Please read and follow the below instructions.  Do not take your medicine if  develop an itchy rash, swelling in your mouth or lips, or difficulty breathing; call 911 and seek immediate emergency medical attention if this occurs.  You may review your lab tests and imaging results in their entirety on your MyChart account.  Please discuss all results of fully with your primary care provider and other specialist at your follow-up visit.  Note: Portions of this text may have been transcribed using voice recognition software. Every effort was made to ensure accuracy; however, inadvertent computerized transcription errors may still be present.

## 2023-09-22 NOTE — ED Triage Notes (Signed)
Patient arrives with complaints of worsening cough and respiratory symptoms x2 days.

## 2023-09-22 NOTE — ED Provider Notes (Signed)
[place] EMERGENCY DEPARTMENT AT [facility] Provider Note   CSN: [id number] Arrival date & time: 09/22/23  1746     History  Chief Complaint  Patient presents with   Cough    James Fernandez is a 68 y.o. male.  With a history of GERD presenting to the ED for evaluation of flulike symptoms.  Symptoms began yesterday.  Symptoms include cough, congestion, body aches, fevers.  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  He did receive his flu vaccine this year.  Cough is nonproductive.  He has been taking Tylenol for the fevers with improvement in his symptoms.  He is on Eliquis and cannot take NSAIDs.  He denies chest pain.   Cough Associated symptoms: chills and fever        Home Medications Prior to Admission medications   Medication Sig Start Date End Date Taking? Authorizing Provider  benzonatate (TESSALON) 100 MG capsule Take 1 capsule (100 mg total) by mouth every 8 (eight) hours. 09/22/23  Yes Kaikoa Magro, Edsel Petrin, PA-C  apixaban (ELIQUIS) 5 MG TABS tablet Indications: Venous Thromboembolism Take 2 tablets (10 mg) by mouth 2 times a day for 7 days, followed by 1 tablet (5 mg) 2 times a day. 02/28/21   [provider]  Multiple Vitamin (MULTIVITAMIN WITH MINERALS) TABS tablet Take 1 tablet by mouth daily.    [provider]  omeprazole (PRILOSEC) 20 MG capsule Take 20 mg by mouth daily.    [provider]  Vitamin D, Ergocalciferol, (DRISDOL) 1.25 MG (50000 UT) CAPS capsule Take 1 capsule (50,000 Units total) by mouth every 7 (seven) days. Patient not taking: Reported on 07/25/2023 10/15/18   Wilder Glade, MD      Allergies    Demerol [meperidine]    Review of Systems   Review of Systems  Constitutional:  Positive for chills and fever.  HENT:  Positive for congestion.   Respiratory:  Positive for cough.   All other systems reviewed and are negative.   Physical Exam Updated Vital  Signs BP 123/74 (BP Location: Right Arm)   Pulse 100   Temp 99.3 F (37.4 C) (Oral)   Resp 20   Ht 6\' 1"  (1.854 m)   Wt 114.8 kg   SpO2 100%   BMI 33.38 kg/m  Physical Exam Vitals and nursing note reviewed.  Constitutional:      General: He is not in acute distress.    Appearance: Normal appearance. He is normal weight. He is not ill-appearing.     Comments: Resting comfortably in chair  HENT:     Head: Normocephalic and atraumatic.  Cardiovascular:     Rate and Rhythm: Normal rate and regular rhythm.  Pulmonary:     Effort: Pulmonary effort is normal. No respiratory distress.     Breath sounds: No wheezing, rhonchi or rales.  Abdominal:     General: Abdomen is flat.  Musculoskeletal:        General: Normal range of motion.     Cervical back: Neck supple.  Skin:    General: Skin is warm and dry.  Neurological:     Mental Status: He is alert and oriented to person, place, and time.  Psychiatric:        Mood and Affect: Mood normal.        Behavior: Behavior normal.     ED Results / Procedures / Treatments   Labs (all labs ordered are  listed, but only abnormal results are displayed) Labs Reviewed  RESP PANEL BY RT-PCR (RSV, FLU A&B, COVID)  [id number] - Abnormal; Notable for the following components:      Result Value   Influenza A by PCR POSITIVE (*)    All other components within normal limits    EKG None  Radiology DG Chest 2 View Result Date: 09/22/2023 CLINICAL DATA:  cough EXAM: CHEST - 2 VIEW COMPARISON:  01/24/2021. FINDINGS: Cardiac silhouette is unremarkable. No pneumothorax or pleural effusion. The lungs are clear. The visualized skeletal structures are unremarkable. IMPRESSION: No acute cardiopulmonary process. Electronically Signed   By: Layla Maw M.D.   On: 09/22/2023 18:54    Procedures Procedures    Medications Ordered in ED Medications - No data to display  ED Course/ Medical Decision Making/ A&P                                  Medical Decision Making Amount and/or Complexity of Data Reviewed Radiology: ordered.  This patient presents to the ED for concern of flulike symptoms, this involves an extensive number of treatment options, and is a complaint that carries with it a high risk of complications and morbidity.  The differential diagnosis includes flu, COVID, RSV  My initial workup includes respiratory panel  Additional history obtained from: Nursing notes from this visit. Wife at bedside  I ordered, reviewed and interpreted labs which include: Respiratory panel.  Influenza A positive.  I ordered imaging studies including chest x-ray I independently visualized and interpreted imaging which showed negative I agree with the radiologist interpretation  Afebrile, hemodynamically stable.  68 year old male presenting to the ED for evaluation of flulike symptoms.  Symptoms began yesterday.  Multiple known sick contacts.  Respiratory panel positive for influenza A.  Likely source of symptoms.  He is in no respiratory distress.  Does have a frequent cough.  He has not been taking anything for the cough at home.  Typical timeline of symptoms and supportive care were discussed with the patient.  He declines Tamiflu prescription.  He was encouraged to quarantine until symptoms improved.  He was given return precautions.  Stable at discharge.  At this time there does not appear to be any evidence of an acute emergency medical condition and the patient appears stable for discharge with appropriate outpatient follow up. Diagnosis was discussed with patient who verbalizes understanding of care plan and is agreeable to discharge. I have discussed return precautions with patient who verbalizes understanding. Patient encouraged to follow-up with their PCP within 1 week. All questions answered.  Note: Portions of this report may have been transcribed using voice recognition software. Every effort was made to ensure accuracy;  however, inadvertent computerized transcription errors may still be present.        Final Clinical Impression(s) / ED Diagnoses Final diagnoses:  Influenza A    Rx / DC Orders ED Discharge Orders          Ordered    benzonatate (TESSALON) 100 MG capsule  Every 8 hours        09/22/23 2033              Mora Bellman 09/22/23 2034    Terald Sleeper, MD 09/22/23 [phone]

## 2024-03-27 ENCOUNTER — Encounter (INDEPENDENT_AMBULATORY_CARE_PROVIDER_SITE_OTHER): Payer: Self-pay
